# Patient Record
Sex: Female | Born: 1995 | Race: White | Hispanic: No | Smoking: Never smoker
Health system: Southern US, Community
[De-identification: ages and names within clinical notes are randomized; demographics above are authoritative.]

## PROBLEM LIST (undated history)

## (undated) DIAGNOSIS — F32A Depression, unspecified: Secondary | ICD-10-CM

## (undated) DIAGNOSIS — F419 Anxiety disorder, unspecified: Secondary | ICD-10-CM

## (undated) DIAGNOSIS — G43909 Migraine, unspecified, not intractable, without status migrainosus: Secondary | ICD-10-CM

## (undated) HISTORY — DX: Depression, unspecified: F32.A

## (undated) HISTORY — DX: Anxiety disorder, unspecified: F41.9

---
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
## 2017-11-30 ENCOUNTER — Other Ambulatory Visit: Payer: Self-pay

## 2017-11-30 ENCOUNTER — Encounter: Payer: Self-pay | Admitting: *Deleted

## 2017-11-30 ENCOUNTER — Emergency Department
Admission: EM | Admit: 2017-11-30 | Discharge: 2017-11-30 | Disposition: A | Discharge status: Home / Self Care | Payer: Worker's Compensation | Attending: Emergency Medicine | Admitting: Emergency Medicine

## 2017-11-30 DIAGNOSIS — Y929 Unspecified place or not applicable: Secondary | ICD-10-CM | POA: Diagnosis not present

## 2017-11-30 DIAGNOSIS — S39012A Strain of muscle, fascia and tendon of lower back, initial encounter: Secondary | ICD-10-CM | POA: Insufficient documentation

## 2017-11-30 DIAGNOSIS — Z91010 Allergy to peanuts: Secondary | ICD-10-CM | POA: Insufficient documentation

## 2017-11-30 DIAGNOSIS — Z79899 Other long term (current) drug therapy: Secondary | ICD-10-CM | POA: Diagnosis not present

## 2017-11-30 DIAGNOSIS — S3992XA Unspecified injury of lower back, initial encounter: Secondary | ICD-10-CM | POA: Diagnosis present

## 2017-11-30 DIAGNOSIS — Y999 Unspecified external cause status: Secondary | ICD-10-CM | POA: Insufficient documentation

## 2017-11-30 DIAGNOSIS — Y939 Activity, unspecified: Secondary | ICD-10-CM | POA: Insufficient documentation

## 2017-11-30 HISTORY — DX: Migraine, unspecified, not intractable, without status migrainosus: G43.909

## 2017-11-30 MED ORDER — IBUPROFEN 800 MG PO TABS: 800.0000 mg | ORAL_TABLET | Freq: Three times a day (TID) | ORAL | 0 refills | Status: DC | PRN

## 2017-11-30 MED ORDER — IBUPROFEN 800 MG PO TABS
800.0000 mg | ORAL_TABLET | Freq: Once | ORAL | Status: AC
  Administered 2017-11-30: 800 mg via ORAL
  Filled 2017-11-30: qty 1

## 2017-11-30 MED ORDER — CYCLOBENZAPRINE HCL 5 MG PO TABS: 5.0000 mg | ORAL_TABLET | Freq: Three times a day (TID) | ORAL | 0 refills | Status: DC | PRN
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
## 2017-11-30 NOTE — Discharge Instructions (Signed)
Please alternate 800 mg of ibuprofen every 8 hours with Tylenol 1000 mg every 8 hours as needed for pain.  You may use Flexeril as needed for any muscle tightness of the lower back.  Apply ice 20 minutes every hour to the lower back and after 2-3 days apply heat.  Follow-up with orthopedics in 1 week for recheck.  Return to the ER for any headaches, vision changes, nausea, vomiting, worsening symptoms or urgent changes in your health.

## 2017-11-30 NOTE — ED Notes (Addendum)
Pt states she wants to file worker's comp. Per [employer] employer profile, no testing required. Spoke to Office managermanager Shantae at [employer] on [address] in [place] who verified no testing is needed.

## 2017-11-30 NOTE — ED Notes (Signed)
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
## 2018-04-07 ENCOUNTER — Ambulatory Visit (INDEPENDENT_AMBULATORY_CARE_PROVIDER_SITE_OTHER): Payer: 59 | Admitting: Maternal Newborn

## 2018-04-07 ENCOUNTER — Encounter: Payer: Self-pay | Admitting: Maternal Newborn

## 2018-04-07 VITALS — BP 104/80 | HR 72 | Ht 71.0 in | Wt 135.0 lb

## 2018-04-07 DIAGNOSIS — N898 Other specified noninflammatory disorders of vagina: Secondary | ICD-10-CM

## 2018-04-07 DIAGNOSIS — Z3009 Encounter for other general counseling and advice on contraception: Secondary | ICD-10-CM | POA: Diagnosis not present

## 2018-04-07 DIAGNOSIS — G43009 Migraine without aura, not intractable, without status migrainosus: Secondary | ICD-10-CM

## 2018-04-07 DIAGNOSIS — N926 Irregular menstruation, unspecified: Secondary | ICD-10-CM | POA: Diagnosis not present

## 2018-04-07 DIAGNOSIS — Z3202 Encounter for pregnancy test, result negative: Secondary | ICD-10-CM

## 2018-04-07 DIAGNOSIS — B373 Candidiasis of vulva and vagina: Secondary | ICD-10-CM | POA: Diagnosis not present

## 2018-04-07 DIAGNOSIS — N912 Amenorrhea, unspecified: Secondary | ICD-10-CM | POA: Diagnosis not present

## 2018-04-07 DIAGNOSIS — B3731 Acute candidiasis of vulva and vagina: Secondary | ICD-10-CM

## 2018-04-07 LAB — POCT URINE PREGNANCY: Preg Test, Ur: NEGATIVE

## 2018-04-07 MED ORDER — FLUCONAZOLE 150 MG PO TABS: 150.0000 mg | ORAL_TABLET | Freq: Once | ORAL | 0 refills | Status: AC

## 2018-04-07 NOTE — Progress Notes (Signed)
Obstetrics & Gynecology Office Visit   Chief Complaint:  Chief Complaint  Patient presents with  . Gynecologic Exam    ?yeast inf - burning inside, itchy; wants bc as well    History of Present Illness: Lorraine Pena presents today with intermittent vaginal itching, vaginal and vulvar irritation which she describes as "burning", and thick yellowish discharge. She reports that these symptoms began around 2.5 months ago around the time that she had sexual intercourse for the first time. She tried Monistat OTC yeast treatment and felt that her symptoms briefly improved, but then returned and have been worsening over the past two weeks.  She also reports an irregular cycle that began after intercourse, and she has had no period for the past two months.  Intercourse was very painful for her. She does not think that she could be pregnant because she was using a condom and she stopped "in the middle" of intercourse because of the pain.  She wants to discuss birth control today as well.  Review of Systems: She reports migraine headaches without aura. Otherwise, review of systems negative unless noted in HPI.  Past Medical History:  Past Medical History:  Diagnosis Date  . Migraine     Past Surgical History:  No past surgical history on file.  Gynecologic History: Patient's last menstrual period was 02/09/2018 (approximate).  Obstetric History: G0P0000  Family History:  Family History  Problem Relation Age of Onset  . Thyroid disease Sister   . Breast cancer Other     Social History:  Social History   Socioeconomic History  . Marital status: Single    Spouse name: Not on file  . Number of children: 0  . Years of education: 6813  . Highest education level: Not on file  Occupational History  . Occupation: Consulting civil engineerstudent    Comment: [employer]  . Occupation: Physicist, medicalretail sales    Comment: Primary school [employer]  Social Needs  . Financial resource strain: Not on file  . Food insecurity:   Worry: Not on file    Inability: Not on file  . Transportation needs:    Medical: Not on file    Non-medical: Not on file  Tobacco Use  . Smoking status: Never Smoker  . Smokeless tobacco: Never Used  Substance and Sexual Activity  . Alcohol use: No    Frequency: Never  . Drug use: No  . Sexual activity: Yes    Birth control/protection: Condom  Lifestyle  . Physical activity:    Days per week: Not on file    Minutes per session: Not on file  . Stress: Not on file  Relationships  . Social connections:    Talks on phone: Not on file    Gets together: Not on file    Attends religious service: Not on file    Active member of club or organization: Not on file    Attends meetings of clubs or organizations: Not on file    Relationship status: Not on file  . Intimate partner violence:    Fear of current or ex partner: Not on file    Emotionally abused: Not on file    Physically abused: Not on file    Forced sexual activity: Not on file  Other Topics Concern  . Not on file  Social History Narrative  . Not on file    Allergies:  Allergies  Allergen Reactions  . Peanut-Containing Drug Products Itching    Medications: Prior to Admission medications  Medication Sig Start Date End Date Taking? Authorizing Provider  cyclobenzaprine (FLEXERIL) 5 MG tablet Take 1-2 tablets (5-10 mg total) by mouth 3 (three) times daily as needed for muscle spasms. Patient not taking: Reported on 04/07/2018 11/30/17   Evon Slack, PA-C  ibuprofen (ADVIL,MOTRIN) 800 MG tablet Take 1 tablet (800 mg total) by mouth every 8 (eight) hours as needed. Patient not taking: Reported on 04/07/2018 11/30/17   Evon Slack, PA-C    Physical Exam Vitals:  Vitals:   04/07/18 1333  BP: 104/80  Pulse: 72   Patient's last menstrual period was 02/09/2018 (approximate).  General: NAD Pulmonary: No increased work of breathing Genitourinary:  External: External female genitalia appear irritated.     Normal urethral meatus, normal Bartholin's and Skene's  glands.    Vagina: Vaginal mucosa appears irritated, small amount  of thick yellow-white discharge present, no evidence of  prolapse.    Cervix: Deferred  Uterus: Deferred  Adnexa: Deferred  Rectal: Deferred  Lymphatic: no evidence of inguinal lymphadenopathy Neurologic: Grossly intact Psychiatric: mood appropriate, affect full  Wet prep: PH: <4.0 Clue Cells: Negative Fungal elements: Positive Trichomonas: Negative  Assessment: 22 y.o. G0P0000 with yeast infection, discussion about contraception.  Plan: Problem List Items Addressed This Visit    None    Visit Diagnoses    Amenorrhea    -  Primary   Relevant Orders   POCT urine pregnancy (Completed)   Vaginal irritation       Relevant Orders   Wet prep, genital   NuSwab Vaginitis Plus (VG+)   Vaginal discharge       Relevant Orders   Wet prep, genital   NuSwab Vaginitis Plus (VG+)   Encounter for counseling regarding contraception       Vulvovaginal candidiasis       Relevant Medications   fluconazole (DIFLUCAN) 150 MG tablet     1) Patient's cycle has recently become irregular. Pregnancy test negative today.  2) NuSwab sent to rule out co-infection, treated for yeast today.  3) She would like to initiate a more reliable form of contraception. Reviewed birth control options available including over the counter/barrier methods; hormonal contraceptive medication including pill, patch, ring, injection, contraceptive implant; and hormonal IUDs. Risks and benefits reviewed.  Questions were answered.  Written information was given to patient to review.   She is currently considering initiation of OCPs. She does have a history of migraines without aura. She will schedule an appointment for an annual exam.  Marcelyn Bruins, CNM 04/07/2018  2:15 PM

## 2018-04-09 LAB — NUSWAB VAGINITIS PLUS (VG+)
CANDIDA ALBICANS, NAA: NEGATIVE
CANDIDA GLABRATA, NAA: NEGATIVE
Chlamydia trachomatis, NAA: NEGATIVE
NEISSERIA GONORRHOEAE, NAA: NEGATIVE
Trich vag by NAA: NEGATIVE

## 2018-04-28 ENCOUNTER — Ambulatory Visit (INDEPENDENT_AMBULATORY_CARE_PROVIDER_SITE_OTHER): Payer: 59 | Admitting: Maternal Newborn

## 2018-04-28 ENCOUNTER — Encounter: Payer: Self-pay | Admitting: Maternal Newborn

## 2018-04-28 VITALS — BP 100/60 | Ht 71.0 in | Wt 134.0 lb

## 2018-04-28 DIAGNOSIS — Z30011 Encounter for initial prescription of contraceptive pills: Secondary | ICD-10-CM

## 2018-04-28 DIAGNOSIS — R51 Headache: Secondary | ICD-10-CM | POA: Diagnosis not present

## 2018-04-28 DIAGNOSIS — Z01411 Encounter for gynecological examination (general) (routine) with abnormal findings: Secondary | ICD-10-CM | POA: Diagnosis not present

## 2018-04-28 DIAGNOSIS — Z01419 Encounter for gynecological examination (general) (routine) without abnormal findings: Secondary | ICD-10-CM

## 2018-04-28 DIAGNOSIS — N926 Irregular menstruation, unspecified: Secondary | ICD-10-CM | POA: Diagnosis not present

## 2018-04-28 MED ORDER — LO LOESTRIN FE 1 MG-10 MCG / 10 MCG PO TABS: 1.0000 | ORAL_TABLET | Freq: Every day | ORAL | 3 refills | Status: DC

## 2018-04-28 NOTE — Progress Notes (Signed)
Gynecology Annual Exam  PCP: Pa, [facility]  Chief Complaint:  Chief Complaint  Patient presents with  . Gynecologic Exam    History of Present Illness: Patient is a 22 y.o. G0P0000 presenting for an annual exam. The patient has no complaints today.   LMP: Patient's last menstrual period was 01/19/2018. Average Interval: irregular Duration of flow: a few days Heavy Menses: no Clots: no Intermenstrual Bleeding: no Postcoital Bleeding: not applicable Dysmenorrhea: no  The patient is not currently sexually active. She currently uses none for contraception. The patient does not perform self breast exams.  There is no notable family history of breast or ovarian cancer in her family.  The patient wears seatbelts: yes.  The patient has regular exercise: yes.    The patient denies current symptoms of depression.    Review of Systems  Constitutional: Negative.   HENT: Negative.   Eyes: Negative.   Respiratory: Negative for cough, shortness of breath and wheezing.   Cardiovascular: Negative for chest pain and palpitations.  Gastrointestinal: Negative for constipation, diarrhea, heartburn and nausea.  Genitourinary: Negative.   Musculoskeletal: Negative.   Skin: Negative.   Neurological: Positive for headaches.       Migraine without aura  Endo/Heme/Allergies: Negative.   Psychiatric/Behavioral: Negative for memory loss. The patient is not nervous/anxious.   All other systems reviewed and are negative.   Past Medical History:  Past Medical History:  Diagnosis Date  . Migraine     Past Surgical History:  History reviewed. No pertinent surgical history.  Gynecologic History:  Patient's last menstrual period was 01/19/2018. Contraception: none Last Pap: N/A, under 21  Obstetric History: G0P0000  Family History:  Family History  Problem Relation Age of Onset  . Thyroid disease Sister   . Breast cancer Other     Social History:  Social History    Socioeconomic History  . Marital status: Single    Spouse name: Not on file  . Number of children: 0  . Years of education: 6713  . Highest education level: Not on file  Occupational History  . Occupation: Consulting civil engineerstudent    Comment: [employer]  . Occupation: Physicist, medicalretail sales    Comment: Primary school [employer]  Social Needs  . Financial resource strain: Not on file  . Food insecurity:    Worry: Not on file    Inability: Not on file  . Transportation needs:    Medical: Not on file    Non-medical: Not on file  Tobacco Use  . Smoking status: Never Smoker  . Smokeless tobacco: Never Used  Substance and Sexual Activity  . Alcohol use: No    Frequency: Never  . Drug use: No  . Sexual activity: Yes    Birth control/protection: Condom  Lifestyle  . Physical activity:    Days per week: Not on file    Minutes per session: Not on file  . Stress: Not on file  Relationships  . Social connections:    Talks on phone: Not on file    Gets together: Not on file    Attends religious service: Not on file    Active member of club or organization: Not on file    Attends meetings of clubs or organizations: Not on file    Relationship status: Not on file  . Intimate partner violence:    Fear of current or ex partner: Not on file    Emotionally abused: Not on file    Physically abused: Not  on file    Forced sexual activity: Not on file  Other Topics Concern  . Not on file  Social History Narrative  . Not on file    Allergies:  Allergies  Allergen Reactions  . Peanut-Containing Drug Products Itching    Medications: Prior to Admission medications   Medication Sig Start Date End Date Taking? Authorizing Provider  cyclobenzaprine (FLEXERIL) 5 MG tablet Take 1-2 tablets (5-10 mg total) by mouth 3 (three) times daily as needed for muscle spasms. Patient not taking: Reported on 04/07/2018 11/30/17   Evon Slack, PA-C  ibuprofen (ADVIL,MOTRIN) 800 MG tablet Take 1 tablet (800 mg total) by mouth  every 8 (eight) hours as needed. Patient not taking: Reported on 04/07/2018 11/30/17   Ronnette Juniper    Physical Exam Vitals: Blood pressure 100/60, height 5\' 11"  (1.803 m), weight 134 lb (60.8 kg), last menstrual period 01/19/2018.  General: NAD HEENT: normocephalic, anicteric Thyroid: no enlargement, no palpable nodules Pulmonary: No increased work of breathing, CTAB Cardiovascular: RRR, no murmurs, rubs, or gallops Breast: Breasts symmetrical, no tenderness, no palpable nodules or masses, no skin or nipple retraction present, no nipple discharge.  No axillary or supraclavicular lymphadenopathy. Abdomen: soft, non-tender, non-distended.  Umbilicus without lesions.  No hepatomegaly, splenomegaly or masses palpable. No evidence of hernia  Genitourinary:  External: Normal external female genitalia.  Normal  urethral meatus, normal Bartholin's and Skene's glands.    Vagina: Normal vaginal mucosa, no evidence of prolapse.    Cervix: unable to examine, could not tolerate speculum  exam  Uterus: Non-enlarged, mobile, normal contour.  No CMT  Adnexa: ovaries non-enlarged, no adnexal masses  Rectal: deferred  Lymphatic: no evidence of inguinal lymphadenopathy Extremities: no edema, erythema, or tenderness Neurologic: Grossly intact Psychiatric: mood appropriate, affect full  Assessment: 22 y.o. G0P0000 routine annual exam  Plan: Problem List Items Addressed This Visit    None    Visit Diagnoses    Women's annual routine gynecological examination    -  Primary   Oral contraception initial prescription       Relevant Medications   LO LOESTRIN FE 1 MG-10 MCG / 10 MCG tablet      1) STI screening was declined.   2) Contraception - She desires to begin oral contraceptive pills. Discussed side effects and necessity of taking every day to ensure effectiveness. Will start on Lo LoEstrin; sample pack given.  3) Follow up 1 year for routine annual exam.  Marcelyn Bruins,  CNM 04/28/2018

## 2018-04-29 ENCOUNTER — Encounter: Payer: Self-pay | Admitting: Maternal Newborn

## 2018-07-03 ENCOUNTER — Encounter: Payer: Self-pay | Admitting: Physician Assistant

## 2018-07-03 ENCOUNTER — Other Ambulatory Visit: Payer: Self-pay

## 2018-07-03 ENCOUNTER — Emergency Department
Admission: EM | Admit: 2018-07-03 | Discharge: 2018-07-03 | Disposition: A | Discharge status: Home / Self Care | Payer: 59 | Attending: Emergency Medicine | Admitting: Emergency Medicine

## 2018-07-03 DIAGNOSIS — L03115 Cellulitis of right lower limb: Secondary | ICD-10-CM

## 2018-07-03 DIAGNOSIS — Z79899 Other long term (current) drug therapy: Secondary | ICD-10-CM | POA: Diagnosis not present

## 2018-07-03 DIAGNOSIS — R2241 Localized swelling, mass and lump, right lower limb: Secondary | ICD-10-CM | POA: Diagnosis present

## 2018-07-03 MED ORDER — SULFAMETHOXAZOLE-TRIMETHOPRIM 800-160 MG PO TABS: 1.0000 | ORAL_TABLET | Freq: Two times a day (BID) | ORAL | 0 refills | Status: DC

## 2018-07-03 MED ORDER — SULFAMETHOXAZOLE-TRIMETHOPRIM 800-160 MG PO TABS
1.0000 | ORAL_TABLET | Freq: Once | ORAL | Status: AC
  Administered 2018-07-03: 1 via ORAL
  Filled 2018-07-03: qty 1

## 2018-07-03 NOTE — ED Triage Notes (Signed)
Pt arrives to ed via POV. Pt ambulatory to room, states she noticed bite on Monday, pt states it started hurting yesterday. Tender with palpation. 9cm area of redness around the site. Bright red center 2.5cm in diameter. Pt stated started hurts when ambulating.

## 2018-07-03 NOTE — Discharge Instructions (Signed)
Keep the area clean, dry, and covered. Take the antibiotic as directed. See your provider for ongoing management, or return as needed.

## 2018-07-03 NOTE — ED Provider Notes (Signed)
[facility] Emergency Department Provider Note ____________________________________________  Time seen: 1200  I have reviewed the triage vital signs and the nursing notes.  HISTORY  Chief Complaint  Insect Bite  HPI Lorraine Pena is a 22 y.o. female who presents to the ED accompanied by her mother, for evaluation of a probable insect bite.  Patient describes an area to the right lateral thigh has been increasing in size and tenderness.  She describes area that is red, firm, and warm to touch.  She describes noting what she thought was a mosquito bite to the leg initially.  She admits to likely shaving over the area before it began to increase in size.  She presents now with a central punctum and an area measuring about 2.57 m in diameter.  There is an additional area of erythema measuring approximately 9 cm.  Patient denies any interim fevers, chills, or sweats.  Past Medical History:  Diagnosis Date  . Migraine     There are no active problems to display for this patient.   History reviewed. No pertinent surgical history.  Prior to Admission medications   Medication Sig Start Date End Date Taking? Authorizing Provider  cyclobenzaprine (FLEXERIL) 5 MG tablet Take 1-2 tablets (5-10 mg total) by mouth 3 (three) times daily as needed for muscle spasms. Patient not taking: Reported on 04/07/2018 11/30/17   Evon SlackGaines, Thomas C, PA-C  ibuprofen (ADVIL,MOTRIN) 800 MG tablet Take 1 tablet (800 mg total) by mouth every 8 (eight) hours as needed. Patient not taking: Reported on 04/07/2018 11/30/17   Amador CunasGaines, Thomas C, PA-C  LO LOESTRIN FE 1 MG-10 MCG / 10 MCG tablet Take 1 tablet by mouth daily. 04/28/18 07/21/18  Oswaldo ConroySchmid, Jacelyn Y, CNM  sulfamethoxazole-trimethoprim (BACTRIM DS,SEPTRA DS) 800-160 MG tablet Take 1 tablet by mouth 2 (two) times daily. 07/03/18   Reginia Battie, Charlesetta IvoryJenise V Bacon, PA-C    Allergies Peanut-containing drug products  Family History  Problem Relation Age  of Onset  . Thyroid disease Sister   . Breast cancer Other     Social History Social History   Tobacco Use  . Smoking status: Never Smoker  . Smokeless tobacco: Never Used  Substance Use Topics  . Alcohol use: No    Frequency: Never  . Drug use: No    Review of Systems  Constitutional: Negative for fever. Eyes: Negative for visual changes. ENT: Negative for sore throat. Cardiovascular: Negative for chest pain. Respiratory: Negative for shortness of breath. Gastrointestinal: Negative for abdominal pain, vomiting and diarrhea. Musculoskeletal: Negative for back pain. Skin: Negative for rash. Right thigh insect bite as above Neurological: Negative for headaches, focal weakness or numbness. ____________________________________________  PHYSICAL EXAM:  VITAL SIGNS: ED Triage Vitals  Enc Vitals Group     BP 07/03/18 1209 115/74     Pulse Rate 07/03/18 1209 77     Resp --      Temp 07/03/18 1209 98.5 F (36.9 C)     Temp Source 07/03/18 1209 Oral     SpO2 07/03/18 1209 97 %     Weight 07/03/18 1202 130 lb (59 kg)     Height 07/03/18 1202 5\' 11"  (1.803 m)     Head Circumference --      Peak Flow --      Pain Score 07/03/18 1202 5     Pain Loc --      Pain Edu? --      Excl. in GC? --     Constitutional:  Alert and oriented. Well appearing and in no distress. Head: Normocephalic and atraumatic. Eyes: Conjunctivae are normal. Normal extraocular movements Cardiovascular: Normal rate, regular rhythm. Normal distal pulses. Respiratory: Normal respiratory effort. No wheezes/rales/rhonchi. Musculoskeletal: Nontender with normal range of motion in all extremities.  Neurologic:  Normal gait without ataxia. Normal speech and language. No gross focal neurologic deficits are appreciated. Skin:  Skin is warm, dry and intact. No rash noted.  Patient with a area to the lateral right thigh measuring 9 cm in diameter of erythema and induration around a 2.5 cm central lesion.  There  is a central punctum consistent with a probable insect bite versus a resolved pustule.  Area is warm to touch and tender to palpation.  There is no fluctuance, pointing, or central abscess appreciated. ____________________________________________  PROCEDURES  Procedures Bactrim DS 1 PO ____________________________________________  INITIAL IMPRESSION / ASSESSMENT AND PLAN / ED COURSE  Patient with ED evaluation of an insect bite to the lateral right thigh.  Patient presents with a local cellulitis.  She will be treated empirically with Bactrim for possible MRSA infection.  She will take the antibiotic as prescribed, and monitor for improvement of her symptoms.  The area around the wound has been marked at the perimeter to confirm improvement.  Patient keep the area clean, dry, and covered. ____________________________________________  FINAL CLINICAL IMPRESSION(S) / ED DIAGNOSES  Final diagnoses:  Cellulitis of right lower extremity      Karmen Stabs, Charlesetta Ivory, PA-C 07/03/18 1351    Jeanmarie Plant, MD 07/03/18 [phone]

## 2018-07-06 ENCOUNTER — Encounter: Payer: Self-pay | Admitting: Emergency Medicine

## 2018-07-06 ENCOUNTER — Other Ambulatory Visit: Payer: Self-pay

## 2018-07-06 ENCOUNTER — Emergency Department
Admission: EM | Admit: 2018-07-06 | Discharge: 2018-07-06 | Disposition: A | Discharge status: Home / Self Care | Payer: 59 | Attending: Emergency Medicine | Admitting: Emergency Medicine

## 2018-07-06 DIAGNOSIS — Z5189 Encounter for other specified aftercare: Secondary | ICD-10-CM | POA: Insufficient documentation

## 2018-07-06 DIAGNOSIS — S70361D Insect bite (nonvenomous), right thigh, subsequent encounter: Secondary | ICD-10-CM | POA: Diagnosis not present

## 2018-07-06 DIAGNOSIS — Z79899 Other long term (current) drug therapy: Secondary | ICD-10-CM | POA: Diagnosis not present

## 2018-07-06 DIAGNOSIS — L02415 Cutaneous abscess of right lower limb: Secondary | ICD-10-CM | POA: Insufficient documentation

## 2018-07-06 DIAGNOSIS — F419 Anxiety disorder, unspecified: Secondary | ICD-10-CM | POA: Insufficient documentation

## 2018-07-06 DIAGNOSIS — W57XXXD Bitten or stung by nonvenomous insect and other nonvenomous arthropods, subsequent encounter: Secondary | ICD-10-CM | POA: Insufficient documentation

## 2018-07-06 DIAGNOSIS — Z91010 Allergy to peanuts: Secondary | ICD-10-CM | POA: Insufficient documentation

## 2018-07-06 MED ORDER — LIDOCAINE HCL (PF) 1 % IJ SOLN
INTRAMUSCULAR | Status: AC
  Filled 2018-07-06: qty 5

## 2018-07-06 MED ORDER — TRAMADOL HCL 50 MG PO TABS
50.0000 mg | ORAL_TABLET | Freq: Once | ORAL | Status: AC
  Administered 2018-07-06: 50 mg via ORAL
  Filled 2018-07-06: qty 1

## 2018-07-06 MED ORDER — TRAMADOL HCL 50 MG PO TABS: 50.0000 mg | ORAL_TABLET | Freq: Two times a day (BID) | ORAL | 0 refills | Status: DC | PRN

## 2018-07-06 NOTE — ED Provider Notes (Signed)
[facility] Emergency Department Provider Note   ____________________________________________   First MD Initiated Contact with Patient 07/06/18 0915     (approximate)  I have reviewed the triage vital signs and the nursing notes.   HISTORY  Chief Complaint Wound Check    HPI Lorraine Pena is a 22 y.o. female patient here for follow-up for insect bite to the right thigh 3 days ago.  Patient was seen at this facility and placed on antibiotics and advised to return back if no improvement.  Patient states the lesion has not spread past the marked area but has increased in size.  Patient denies drainage.  Patient denies fever associated with this complaint.  Patient rates the pain as a 3/10.  Patient described the pain is "dull ache".   Past Medical History:  Diagnosis Date  . Migraine     There are no active problems to display for this patient.   History reviewed. No pertinent surgical history.  Prior to Admission medications   Medication Sig Start Date End Date Taking? Authorizing Provider  cyclobenzaprine (FLEXERIL) 5 MG tablet Take 1-2 tablets (5-10 mg total) by mouth 3 (three) times daily as needed for muscle spasms. Patient not taking: Reported on 04/07/2018 11/30/17   Evon SlackGaines, Thomas C, PA-C  ibuprofen (ADVIL,MOTRIN) 800 MG tablet Take 1 tablet (800 mg total) by mouth every 8 (eight) hours as needed. Patient not taking: Reported on 04/07/2018 11/30/17   Amador CunasGaines, Thomas C, PA-C  LO LOESTRIN FE 1 MG-10 MCG / 10 MCG tablet Take 1 tablet by mouth daily. 04/28/18 07/21/18  Oswaldo ConroySchmid, Jacelyn Y, CNM  sulfamethoxazole-trimethoprim (BACTRIM DS,SEPTRA DS) 800-160 MG tablet Take 1 tablet by mouth 2 (two) times daily. 07/03/18   Menshew, Charlesetta IvoryJenise V Bacon, PA-C  traMADol (ULTRAM) 50 MG tablet Take 1 tablet (50 mg total) by mouth every 12 (twelve) hours as needed. 07/06/18   Joni ReiningSmith, Luciel Brickman K, PA-C    Allergies Peanut-containing drug products  Family History  Problem  Relation Age of Onset  . Thyroid disease Sister   . Breast cancer Other     Social History Social History   Tobacco Use  . Smoking status: Never Smoker  . Smokeless tobacco: Never Used  Substance Use Topics  . Alcohol use: No    Frequency: Never  . Drug use: No    Review of Systems Constitutional: No fever/chills Eyes: No visual changes. ENT: No sore throat. Cardiovascular: Denies chest pain. Respiratory: Denies shortness of breath. Gastrointestinal: No abdominal pain.  No nausea, no vomiting.  No diarrhea.  No constipation. Genitourinary: Negative for dysuria. Musculoskeletal: Negative for back pain. Skin: Negative for rash.  Insect bite right side Neurological: Negative for headaches, focal weakness or numbness. Allergic/Immunilogical: Peanuts  ____________________________________________   PHYSICAL EXAM:  VITAL SIGNS: ED Triage Vitals  Enc Vitals Group     BP 07/06/18 0900 126/86     Pulse Rate 07/06/18 0900 90     Resp 07/06/18 0900 18     Temp 07/06/18 0900 98.1 F (36.7 C)     Temp Source 07/06/18 0900 Oral     SpO2 07/06/18 0900 98 %     Weight 07/06/18 0903 130 lb (59 kg)     Height 07/06/18 0903 5\' 11"  (1.803 m)     Head Circumference --      Peak Flow --      Pain Score 07/06/18 0903 3     Pain Loc --  Pain Edu? --      Excl. in GC? --    Constitutional: Alert and oriented. Well appearing and in no acute distress.  Anxious Cardiovascular: Normal rate, regular rhythm. Grossly normal heart sounds.  Good peripheral circulation. Respiratory: Normal respiratory effort.  No retractions. Lungs CTAB. Skin:  Skin is warm, dry and intact. No rash noted.  Nodule lesion on erythematous base right lateral thigh. Psychiatric: Mood and affect are normal. Speech and behavior are normal.  ____________________________________________   LABS (all labs ordered are listed, but only abnormal results are displayed)  Labs Reviewed - No data to  display ____________________________________________  EKG   ____________________________________________  RADIOLOGY  ED MD interpretation:    Official radiology report(s): No results found.  ____________________________________________   PROCEDURES  Procedure(s) performed: None  .Marland KitchenIncision and Drainage Date/Time: 07/06/2018 9:30 AM Performed by: Joni Reining, PA-C Authorized by: Joni Reining, PA-C   Consent:    Consent obtained:  Verbal   Consent given by:  Patient   Risks discussed:  Bleeding, incomplete drainage, pain and infection Location:    Type:  Abscess   Location:  Lower extremity   Lower extremity location:  Leg   Leg location:  R upper leg Pre-procedure details:    Skin preparation:  Betadine Anesthesia (see MAR for exact dosages):    Anesthesia method:  Local infiltration   Local anesthetic:  Lidocaine 1% w/o epi Procedure type:    Complexity:  Simple Procedure details:    Incision types:  Single straight   Incision depth:  Dermal   Scalpel blade:  11   Wound management:  Probed and deloculated   Drainage amount:  Moderate   Wound treatment:  Drain placed   Packing materials:  1/4 in iodoform gauze Post-procedure details:    Patient tolerance of procedure:  Tolerated well, no immediate complications    Critical Care performed: No  ____________________________________________   INITIAL IMPRESSION / ASSESSMENT AND PLAN / ED COURSE  As part of my medical decision making, I reviewed the following data within the electronic MEDICAL RECORD NUMBER    Abscess right leg secondary to insect bite which required incision and drainage today.  Patient given discharge care instruction.  Patient advised to continue antibiotics.  Patient advised to return back in 2 days for wound recheck.      ____________________________________________   FINAL CLINICAL IMPRESSION(S) / ED DIAGNOSES  Final diagnoses:  Wound check, abscess     ED Discharge  Orders         Ordered    traMADol (ULTRAM) 50 MG tablet  Every 12 hours PRN     07/06/18 0944           Note:  This document was prepared using Dragon voice recognition software and may include unintentional dictation errors.    Joni Reining, PA-C 07/06/18 1610    Jene Every, MD 07/06/18 1201

## 2018-07-06 NOTE — ED Triage Notes (Signed)
States was seen Friday for possible for sider bite R thigh. States swelling has gone down. Here for wound check.

## 2018-07-06 NOTE — ED Notes (Signed)
See triage note  Was seen on friday for insect bite to right leg  Area is red and swollen  Per family the redness has improved but center of area is more swollen and tender

## 2018-07-06 NOTE — Discharge Instructions (Signed)
Follow discharge care instruction continue previous medication.

## 2018-07-08 ENCOUNTER — Encounter: Payer: Self-pay | Admitting: Emergency Medicine

## 2018-07-08 ENCOUNTER — Emergency Department
Admission: EM | Admit: 2018-07-08 | Discharge: 2018-07-08 | Disposition: A | Discharge status: Home / Self Care | Payer: 59 | Attending: Emergency Medicine | Admitting: Emergency Medicine

## 2018-07-08 ENCOUNTER — Other Ambulatory Visit: Payer: Self-pay

## 2018-07-08 DIAGNOSIS — Z79899 Other long term (current) drug therapy: Secondary | ICD-10-CM | POA: Insufficient documentation

## 2018-07-08 DIAGNOSIS — Z09 Encounter for follow-up examination after completed treatment for conditions other than malignant neoplasm: Secondary | ICD-10-CM

## 2018-07-08 DIAGNOSIS — Z91010 Allergy to peanuts: Secondary | ICD-10-CM | POA: Diagnosis not present

## 2018-07-08 DIAGNOSIS — S81801D Unspecified open wound, right lower leg, subsequent encounter: Secondary | ICD-10-CM | POA: Diagnosis not present

## 2018-07-08 DIAGNOSIS — X58XXXA Exposure to other specified factors, initial encounter: Secondary | ICD-10-CM | POA: Insufficient documentation

## 2018-07-08 NOTE — ED Triage Notes (Signed)
Pt states had abscess lanced on 9/16, returns today for wound recheck, packing in place with small amounts of bloody drainage noted to wound, pt states redness improving as well.

## 2018-07-08 NOTE — ED Notes (Signed)
NAD noted at time of D/C. Pt denies questions or concerns. Pt ambulatory to the lobby at this time.  

## 2018-07-08 NOTE — ED Provider Notes (Signed)
[facility] Emergency Department Provider Note   ____________________________________________   First MD Initiated Contact with Patient 07/08/18 1011     (approximate)  I have reviewed the triage vital signs and the nursing notes.   HISTORY  Chief Complaint Wound Check    HPI Lorraine Pena is a 22 y.o. female patient presents for wound check secondary to incision and drainage of abscess to the right lateral leg 2 days ago.  Patient was no complaints at this time.   Past Medical History:  Diagnosis Date  . Migraine     There are no active problems to display for this patient.   History reviewed. No pertinent surgical history.  Prior to Admission medications   Medication Sig Start Date End Date Taking? Authorizing Provider  cyclobenzaprine (FLEXERIL) 5 MG tablet Take 1-2 tablets (5-10 mg total) by mouth 3 (three) times daily as needed for muscle spasms. Patient not taking: Reported on 04/07/2018 11/30/17   Evon Slack, PA-C  ibuprofen (ADVIL,MOTRIN) 800 MG tablet Take 1 tablet (800 mg total) by mouth every 8 (eight) hours as needed. Patient not taking: Reported on 04/07/2018 11/30/17   Amador Cunas C, PA-C  LO LOESTRIN FE 1 MG-10 MCG / 10 MCG tablet Take 1 tablet by mouth daily. 04/28/18 07/21/18  Oswaldo Conroy, CNM  sulfamethoxazole-trimethoprim (BACTRIM DS,SEPTRA DS) 800-160 MG tablet Take 1 tablet by mouth 2 (two) times daily. 07/03/18   Menshew, Charlesetta Ivory, PA-C  traMADol (ULTRAM) 50 MG tablet Take 1 tablet (50 mg total) by mouth every 12 (twelve) hours as needed. 07/06/18   Joni Reining, PA-C    Allergies Peanut-containing drug products  Family History  Problem Relation Age of Onset  . Thyroid disease Sister   . Breast cancer Other     Social History Social History   Tobacco Use  . Smoking status: Never Smoker  . Smokeless tobacco: Never Used  Substance Use Topics  . Alcohol use: No    Frequency: Never  . Drug  use: No    Review of Systems  Constitutional: No fever/chills Eyes: No visual changes. ENT: No sore throat. Cardiovascular: Denies chest pain. Respiratory: Denies shortness of breath. Gastrointestinal: No abdominal pain.  No nausea, no vomiting.  No diarrhea.  No constipation. Genitourinary: Negative for dysuria. Musculoskeletal: Negative for back pain. Skin: Healing abscess right lower leg.  Neurological: Negative for headaches, focal weakness or numbness. Allergic/Immunilogical: Peanuts ____________________________________________   PHYSICAL EXAM:  VITAL SIGNS: ED Triage Vitals  Enc Vitals Group     BP 07/08/18 0954 (!) 122/92     Pulse Rate 07/08/18 0954 69     Resp 07/08/18 0954 18     Temp 07/08/18 0954 98.3 F (36.8 C)     Temp Source 07/08/18 0954 Oral     SpO2 07/08/18 0954 98 %     Weight 07/08/18 1004 130 lb 1.1 oz (59 kg)     Height 07/08/18 1004 5\' 11"  (1.803 m)     Head Circumference --      Peak Flow --      Pain Score 07/08/18 1004 2     Pain Loc --      Pain Edu? --      Excl. in GC? --    Constitutional: Alert and oriented. Well appearing and in no acute distress. Cardiovascular: Normal rate, regular rhythm. Grossly normal heart sounds.  Good peripheral circulation. Respiratory: Normal respiratory effort.  No retractions. Lungs CTAB. Skin:  Incision site right lateral leg secondary to incision and drainage.  Psychiatric: Mood and affect are normal. Speech and behavior are normal.  ____________________________________________   LABS (all labs ordered are listed, but only abnormal results are displayed)  Labs Reviewed - No data to display ____________________________________________  EKG   ____________________________________________  RADIOLOGY  ED MD interpretation:    Official radiology report(s): No results found.  ____________________________________________   PROCEDURES  Procedure(s) performed: None  Procedures  Critical Care  performed: No  ____________________________________________   INITIAL IMPRESSION / ASSESSMENT AND PLAN / ED COURSE  As part of my medical decision making, I reviewed the following data within the electronic MEDICAL RECORD NUMBER    Healing abscess right leg status post I&D.  Packing material was removed with wound irrigated with clear return.  Patient wound was clean and bandaged and patient given discharge care instructions.  Advised follow-up department if condition worsens.      ____________________________________________   FINAL CLINICAL IMPRESSION(S) / ED DIAGNOSES  Final diagnoses:  Encounter for recheck of abscess following incision and drainage     ED Discharge Orders    None       Note:  This document was prepared using Dragon voice recognition software and may include unintentional dictation errors.    Joni ReiningSmith, Allee Busk K, PA-C 07/08/18 1042    Emily FilbertWilliams, Jonathan E, MD 07/08/18 [phone]

## 2018-07-08 NOTE — ED Notes (Signed)
See this RN's triage.

## 2018-07-30 ENCOUNTER — Encounter: Payer: Self-pay | Admitting: Obstetrics and Gynecology

## 2018-07-30 ENCOUNTER — Ambulatory Visit (INDEPENDENT_AMBULATORY_CARE_PROVIDER_SITE_OTHER): Payer: 59 | Admitting: Obstetrics and Gynecology

## 2018-07-30 VITALS — BP 102/58 | HR 72 | Ht 71.0 in | Wt 134.0 lb

## 2018-07-30 DIAGNOSIS — N3001 Acute cystitis with hematuria: Secondary | ICD-10-CM

## 2018-07-30 DIAGNOSIS — Z113 Encounter for screening for infections with a predominantly sexual mode of transmission: Secondary | ICD-10-CM

## 2018-07-30 DIAGNOSIS — R300 Dysuria: Secondary | ICD-10-CM

## 2018-07-30 DIAGNOSIS — R102 Pelvic and perineal pain: Secondary | ICD-10-CM | POA: Diagnosis not present

## 2018-07-30 DIAGNOSIS — N760 Acute vaginitis: Secondary | ICD-10-CM

## 2018-07-30 LAB — POCT URINALYSIS DIPSTICK
Bilirubin, UA: NEGATIVE
Glucose, UA: NEGATIVE
KETONES UA: NEGATIVE
Nitrite, UA: NEGATIVE
PH UA: 6.5 (ref 5.0–8.0)
PROTEIN UA: POSITIVE — AB
SPEC GRAV UA: 1.02 (ref 1.010–1.025)
UROBILINOGEN UA: 0.2 U/dL

## 2018-07-30 MED ORDER — NITROFURANTOIN MONOHYD MACRO 100 MG PO CAPS: 100.0000 mg | ORAL_CAPSULE | Freq: Two times a day (BID) | ORAL | 0 refills | Status: DC

## 2018-07-30 MED ORDER — FLUCONAZOLE 150 MG PO TABS: 150.0000 mg | ORAL_TABLET | ORAL | 0 refills | Status: AC

## 2018-07-30 NOTE — Progress Notes (Signed)
   Patient ID: Lorraine Pena, female   DOB: [birth year], 22 y.o.   MRN: [id number]  Reason for Consult: Vaginitis   Referred by Pa, [facility]*  Subjective:     HPI:  Lorraine Pena is a 22 y.o. female - she reports severe cramping 2 days after period stopped, caused nausea, vaginal discharge that is yellow.    Past Medical History:  Diagnosis Date  . Migraine    Family History  Problem Relation Age of Onset  . Thyroid disease Sister   . Breast cancer Other    History reviewed. No pertinent surgical history.  Short Social History:  Social History   Tobacco Use  . Smoking status: Never Smoker  . Smokeless tobacco: Never Used  Substance Use Topics  . Alcohol use: No    Frequency: Never    Allergies  Allergen Reactions  . Peanut-Containing Drug Products Itching    Current Outpatient Medications  Medication Sig Dispense Refill  . cyclobenzaprine (FLEXERIL) 5 MG tablet Take 1-2 tablets (5-10 mg total) by mouth 3 (three) times daily as needed for muscle spasms. (Patient not taking: Reported on 04/07/2018) 20 tablet 0  . fluconazole (DIFLUCAN) 150 MG tablet Take 1 tablet (150 mg total) by mouth every 3 (three) days for 2 doses. 2 tablet 0  . ibuprofen (ADVIL,MOTRIN) 800 MG tablet Take 1 tablet (800 mg total) by mouth every 8 (eight) hours as needed. (Patient not taking: Reported on 04/07/2018) 30 tablet 0  . LO LOESTRIN FE 1 MG-10 MCG / 10 MCG tablet Take 1 tablet by mouth daily. 84 tablet 3  . sulfamethoxazole-trimethoprim (BACTRIM DS,SEPTRA DS) 800-160 MG tablet Take 1 tablet by mouth 2 (two) times daily. (Patient not taking: Reported on 07/30/2018) 20 tablet 0  . traMADol (ULTRAM) 50 MG tablet Take 1 tablet (50 mg total) by mouth every 12 (twelve) hours as needed. (Patient not taking: Reported on 07/30/2018) 12 tablet 0   No current facility-administered medications for this visit.     REVIEW OF SYSTEMS      Objective:  Objective   Vitals:    07/30/18 1049  BP: (!) 102/58  Pulse: 72  Weight: 134 lb (60.8 kg)  Height: 5\' 11"  (1.803 m)   Body mass index is 18.69 kg/m.  Physical Exam  Da     Assessment/Plan:     21 yo  Yeast infection- diflucan prescription sent UTI- will treat with antibiotics NUswab VG plus for confirmation STD screening Discussed birth control options.     Adelene Idler MD [facility], [facility] 07/30/18 11:51 AM

## 2018-07-31 LAB — RPR: RPR: NONREACTIVE

## 2018-07-31 LAB — HEPATITIS PANEL, ACUTE
HEP A IGM: NEGATIVE
HEP B C IGM: NEGATIVE
HEP B S AG: NEGATIVE

## 2018-07-31 LAB — HIV ANTIBODY (ROUTINE TESTING W REFLEX): HIV Screen 4th Generation wRfx: NONREACTIVE

## 2018-08-01 LAB — URINE CULTURE

## 2018-08-02 LAB — NUSWAB VAGINITIS PLUS (VG+)
CANDIDA ALBICANS, NAA: POSITIVE — AB
CANDIDA GLABRATA, NAA: NEGATIVE
Chlamydia trachomatis, NAA: NEGATIVE
Neisseria gonorrhoeae, NAA: NEGATIVE
TRICH VAG BY NAA: NEGATIVE

## 2018-08-04 NOTE — Progress Notes (Signed)
Discussed on the phone with patient. She is feeling better on medication for the UTI and yeast infection

## 2018-09-01 ENCOUNTER — Ambulatory Visit: Payer: 59 | Admitting: Obstetrics and Gynecology

## 2018-09-02 ENCOUNTER — Ambulatory Visit (INDEPENDENT_AMBULATORY_CARE_PROVIDER_SITE_OTHER): Payer: 59 | Admitting: Obstetrics and Gynecology

## 2018-09-02 ENCOUNTER — Other Ambulatory Visit (HOSPITAL_COMMUNITY)
Admission: RE | Admit: 2018-09-02 | Discharge: 2018-09-02 | Disposition: A | Discharge status: Home / Self Care | Payer: 59 | Source: Ambulatory Visit | Attending: Obstetrics and Gynecology | Admitting: Obstetrics and Gynecology

## 2018-09-02 ENCOUNTER — Encounter: Payer: Self-pay | Admitting: Obstetrics and Gynecology

## 2018-09-02 VITALS — BP 98/66 | HR 57 | Ht 71.0 in | Wt 132.0 lb

## 2018-09-02 DIAGNOSIS — Z113 Encounter for screening for infections with a predominantly sexual mode of transmission: Secondary | ICD-10-CM | POA: Diagnosis not present

## 2018-09-02 DIAGNOSIS — Z202 Contact with and (suspected) exposure to infections with a predominantly sexual mode of transmission: Secondary | ICD-10-CM | POA: Insufficient documentation

## 2018-09-02 DIAGNOSIS — N3001 Acute cystitis with hematuria: Secondary | ICD-10-CM | POA: Diagnosis not present

## 2018-09-02 LAB — POCT URINALYSIS DIPSTICK
Bilirubin, UA: NEGATIVE
GLUCOSE UA: NEGATIVE
Ketones, UA: NEGATIVE
Nitrite, UA: NEGATIVE
PH UA: 5 (ref 5.0–8.0)
PROTEIN UA: NEGATIVE
Spec Grav, UA: 1.02 (ref 1.010–1.025)

## 2018-09-02 MED ORDER — NITROFURANTOIN MONOHYD MACRO 100 MG PO CAPS: 100.0000 mg | ORAL_CAPSULE | Freq: Two times a day (BID) | ORAL | 0 refills | Status: AC

## 2018-09-02 NOTE — Progress Notes (Signed)
Pa, [employer] Complaint  Patient presents with  . STI testing    pt thinks she has UTI, burns when urinates, frequency, no blood or odor and would like to get tested for all since she found out her bf cheated on her    HPI:      [name] is a 22 y.o. G0P0000 who LMP was Patient's last menstrual period was 08/17/2018 (approximate)., presents today for STD testing. Found out previous partner was unfaithful 2 days ago. She doesn't use condoms due to latex allergy. Had neg STD testing, including HIV, RPR, Hep panel, 07/30/18 and neg nuswab 04/07/18. No vag sx.   Also has UTI sx of dysuria, frequency since yesterday. No LBP, belly pain, fevers. No vag bleeding. Treated for UTI 9/19 with Bactrim. Had pos C&S.  Past Medical History:  Diagnosis Date  . Migraine     History reviewed. No pertinent surgical history.  Family History  Problem Relation Age of Onset  . Thyroid disease Sister   . Breast cancer Other     Social History   Socioeconomic History  . Marital status: Single    Spouse name: Not on file  . Number of children: 0  . Years of education: 29  . Highest education level: Not on file  Occupational History  . Occupation: Consulting civil engineer    Comment: [employer]  . Occupation: Physicist, 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[employer]  . Financial resource strain: Not on file  . Food insecurity:    Worry: Not on file    Inability: Not on file  . Transportation needs:    Medical: Not on file    Non-medical: Not on file  Tobacco Use  . Smoking status: Never Smoker  . Smokeless tobacco: Never Used  Substance and Sexual Activity  . Alcohol use: No    Frequency: Never  . Drug use: No  . Sexual activity: Yes    Birth control/protection: Pill  Lifestyle  . Physical activity:    Days per week: Not on file    Minutes per session: Not on file  . Stress: Not on file  Relationships  . Social connections:    Talks on phone: Not on file      Gets together: Not on file    Attends religious service: Not on file    Active member of club or organization: Not on file    Attends meetings of clubs or organizations: Not on file    Relationship status: Not on file  . Intimate partner violence:    Fear of current or ex partner: Not on file    Emotionally abused: Not on file    Physically abused: Not on file    Forced sexual activity: Not on file  Other Topics Concern  . Not on file  Social History Narrative  . Not on file    Outpatient Medications Prior to Visit  Medication Sig Dispense Refill  . ibuprofen (ADVIL,MOTRIN) 800 MG tablet Take 1 tablet (800 mg total) by mouth every 8 (eight) hours as needed. 30 tablet 0  . LO LOESTRIN FE 1 MG-10 MCG / 10 MCG tablet Take 1 tablet by mouth daily. 84 tablet 3  . cyclobenzaprine (FLEXERIL) 5 MG tablet Take 1-2 tablets (5-10 mg total) by mouth 3 (three) times daily as needed for muscle spasms. (Patient not taking: Reported on 04/07/2018) 20 tablet 0  . sulfamethoxazole-trimethoprim (BACTRIM DS,SEPTRA DS)  800-160 MG tablet Take 1 tablet by mouth 2 (two) times daily. (Patient not taking: Reported on 07/30/2018) 20 tablet 0  . traMADol (ULTRAM) 50 MG tablet Take 1 tablet (50 mg total) by mouth every 12 (twelve) hours as needed. (Patient not taking: Reported on 07/30/2018) 12 tablet 0   No facility-administered medications prior to visit.       ROS:  Review of Systems  Constitutional: Negative for fatigue, fever and unexpected weight change.  Respiratory: Negative for cough, shortness of breath and wheezing.   Cardiovascular: Negative for chest pain, palpitations and leg swelling.  Gastrointestinal: Negative for blood in stool, constipation, diarrhea, nausea and vomiting.  Endocrine: Negative for cold intolerance, heat intolerance and polyuria.  Genitourinary: Positive for dysuria and frequency. Negative for dyspareunia, flank pain, genital sores, hematuria, menstrual problem, pelvic  pain, urgency, vaginal bleeding, vaginal discharge and vaginal pain.  Musculoskeletal: Negative for back pain, joint swelling and myalgias.  Skin: Negative for rash.  Neurological: Negative for dizziness, syncope, light-headedness, numbness and headaches.  Hematological: Negative for adenopathy.  Psychiatric/Behavioral: Negative for agitation, confusion, sleep disturbance and suicidal ideas. The patient is not nervous/anxious.    BREAST: No symptoms   OBJECTIVE:   Vitals:  BP 98/66   Pulse (!) 57   Ht 5\' 11"  (1.803 m)   Wt 132 lb (59.9 kg)   LMP 08/17/2018 (Approximate)   BMI 18.41 kg/m   Physical Exam  Constitutional: She is oriented to person, place, and time. Vital signs are normal. She appears well-developed.  Pulmonary/Chest: Effort normal.  Genitourinary: Vagina normal and uterus normal. There is no rash, tenderness or lesion on the right labia. There is no rash, tenderness or lesion on the left labia. Uterus is not enlarged and not tender. Cervix exhibits no motion tenderness. Right adnexum displays no mass and no tenderness. Left adnexum displays no mass and no tenderness. No erythema or tenderness in the vagina. No vaginal discharge found.  Musculoskeletal: Normal range of motion.  Neurological: She is alert and oriented to person, place, and time.  Psychiatric: She has a normal mood and affect. Her behavior is normal. Thought content normal.  Vitals reviewed.   Results: Results for orders placed or performed in visit on 09/02/18 (from the past 24 hour(s))  POCT Urinalysis Dipstick     Status: Abnormal   Collection Time: 09/02/18  9:04 AM  Result Value Ref Range   Color, UA amber    Clarity, UA clear    Glucose, UA Negative Negative   Bilirubin, UA neg    Ketones, UA neg    Spec Grav, UA 1.020 1.010 - 1.025   Blood, UA small    pH, UA 5.0 5.0 - 8.0   Protein, UA Negative Negative   Urobilinogen, UA     Nitrite, UA neg    Leukocytes, UA Trace (A) Negative    Appearance     Odor       Assessment/Plan: Acute cystitis with hematuria - Pos sx/UA. Rx macrobid. Check C&S. F/u prn - Plan: POCT Urinalysis Dipstick, Urine Culture, nitrofurantoin, macrocrystal-monohydrate, (MACROBID) 100 MG capsule  Screening for STD (sexually transmitted disease) - STD testing. Will call with results. Suggested HIV testing in 3 months since neg 10/19. - Plan: Cervicovaginal ancillary only, RPR, Hepatitis C antibody  Possible exposure to STD - Plan: Cervicovaginal ancillary only    Meds ordered this encounter  Medications  . nitrofurantoin, macrocrystal-monohydrate, (MACROBID) 100 MG capsule    Sig: Take 1 capsule (100 mg  total) by mouth 2 (two) times daily for 5 days.    Dispense:  10 capsule    Refill:  0    Order Specific Question:   Supervising Provider    Answer:   Nadara Mustard [[id number]]      Return if symptoms worsen or fail to improve.  Digby Groeneveld B. Muad [place]oga, PA-C 09/02/2018 9:06 AM

## 2018-09-02 NOTE — Patient Instructions (Signed)
I value your feedback and entrusting us with your care. If you get a  patient survey, I would appreciate you taking the time to let us know about your experience today. Thank you! 

## 2018-09-03 LAB — RPR: RPR Ser Ql: NONREACTIVE

## 2018-09-03 LAB — CERVICOVAGINAL ANCILLARY ONLY
Chlamydia: NEGATIVE
NEISSERIA GONORRHEA: NEGATIVE
Trichomonas: NEGATIVE

## 2018-09-03 LAB — HEPATITIS C ANTIBODY

## 2018-09-03 NOTE — Progress Notes (Signed)
Pt aware.

## 2018-09-03 NOTE — Progress Notes (Signed)
Pls let pt know blood work testing neg. Still waiting on gon/chlam. Thx

## 2018-09-04 LAB — URINE CULTURE

## 2018-09-04 NOTE — Progress Notes (Signed)
Pls let pt know all STD testing and C&S neg. Can stop abx. F/u prn.

## 2018-09-04 NOTE — Progress Notes (Signed)
Called pt, no answer, LVMTRC. 

## 2018-10-11 ENCOUNTER — Other Ambulatory Visit: Payer: Self-pay

## 2018-10-11 ENCOUNTER — Encounter: Payer: Self-pay | Admitting: Emergency Medicine

## 2018-10-11 ENCOUNTER — Emergency Department
Admission: EM | Admit: 2018-10-11 | Discharge: 2018-10-11 | Disposition: A | Discharge status: Home / Self Care | Payer: 59 | Attending: Emergency Medicine | Admitting: Emergency Medicine

## 2018-10-11 DIAGNOSIS — J101 Influenza due to other identified influenza virus with other respiratory manifestations: Secondary | ICD-10-CM | POA: Insufficient documentation

## 2018-10-11 DIAGNOSIS — J029 Acute pharyngitis, unspecified: Secondary | ICD-10-CM | POA: Diagnosis present

## 2018-10-11 LAB — GROUP A STREP BY PCR: Group A Strep by PCR: NOT DETECTED

## 2018-10-11 LAB — INFLUENZA PANEL BY PCR (TYPE A & B)
INFLAPCR: NEGATIVE
Influenza B By PCR: POSITIVE — AB

## 2018-10-11 MED ORDER — BENZONATATE 200 MG PO CAPS: 200.0000 mg | ORAL_CAPSULE | Freq: Three times a day (TID) | ORAL | 0 refills | Status: DC | PRN

## 2018-10-11 NOTE — ED Notes (Signed)
See triage note States she had sore throat on Friday  But yesterday she developed subjective fever  Chills and body aches   Afebrile on arrival but states she has no energy

## 2018-10-11 NOTE — Discharge Instructions (Addendum)
Follow-up with your regular doctor or the acute care if not better in 5 to 7 days.  Return emergency department worsening.  Tylenol and ibuprofen for fever as needed.  Cough medication as prescribed.  Drink plenty of fluids.

## 2018-10-11 NOTE — ED Triage Notes (Signed)
Sore throat x 2 days. Generalized body aches.

## 2018-10-11 NOTE — ED Provider Notes (Signed)
[facility] Emergency Department Provider Note  ____________________________________________   First MD Initiated Contact with Patient 10/11/18 1400     (approximate)  I have reviewed the triage vital signs and the nursing notes.   HISTORY  Chief Complaint Sore Throat    HPI Lorraine Pena is a 22 y.o. female presents emergency department complaint of sore throat and fever for 3 days.  She has had chills and body aches with a dry cough.  She has no energy.  She states that fever started yesterday.  She states multiple people at work have flu.  Denies any vomiting or diarrhea.    Past Medical History:  Diagnosis Date  . Migraine     There are no active problems to display for this patient.   History reviewed. No pertinent surgical history.  Prior to Admission medications   Medication Sig Start Date End Date Taking? Authorizing Provider  benzonatate (TESSALON) 200 MG capsule Take 1 capsule (200 mg total) by mouth 3 (three) times daily as needed for cough. 10/11/18   , Roselyn BeringSusan W, PA-C  ibuprofen (ADVIL,MOTRIN) 800 MG tablet Take 1 tablet (800 mg total) by mouth every 8 (eight) hours as needed. 11/30/17   Amador CunasGaines, Thomas C, PA-C  LO LOESTRIN FE 1 MG-10 MCG / 10 MCG tablet Take 1 tablet by mouth daily. 04/28/18 07/21/18  Oswaldo ConroySchmid, Jacelyn Y, CNM    Allergies Peanut-containing drug products  Family History  Problem Relation Age of Onset  . Thyroid disease Sister   . Breast cancer Other     Social History Social History   Tobacco Use  . Smoking status: Never Smoker  . Smokeless tobacco: Never Used  Substance Use Topics  . Alcohol use: No    Frequency: Never  . Drug use: No    Review of Systems  Constitutional: Positive fever/chills Eyes: No visual changes. ENT: Positive sore throat. Respiratory: Positive cough Genitourinary: Negative for dysuria. Musculoskeletal: Negative for back pain. Skin: Negative for  rash.    ____________________________________________   PHYSICAL EXAM:  VITAL SIGNS: ED Triage Vitals  Enc Vitals Group     BP 10/11/18 1335 111/89     Pulse Rate 10/11/18 1335 94     Resp 10/11/18 1335 12     Temp 10/11/18 1335 99.6 F (37.6 C)     Temp Source 10/11/18 1335 Oral     SpO2 10/11/18 1335 99 %     Weight 10/11/18 1339 135 lb (61.2 kg)     Height 10/11/18 1339 5\' 11"  (1.803 m)     Head Circumference --      Peak Flow --      Pain Score 10/11/18 1339 4     Pain Loc --      Pain Edu? --      Excl. in GC? --     Constitutional: Alert and oriented. Well appearing and in no acute distress. Eyes: Conjunctivae are normal.  Head: Atraumatic. Nose: No congestion/rhinnorhea. Mouth/Throat: Mucous membranes are moist.  Throat is mildly red Neck:  supple no lymphadenopathy noted Cardiovascular: Normal rate, regular rhythm. Heart sounds are normal Respiratory: Normal respiratory effort.  No retractions, lungs c t a  Abd: soft nontender bs normal all 4 quad GU: deferred Musculoskeletal: FROM all extremities, warm and well perfused Neurologic:  Normal speech and language.  Skin:  Skin is warm, dry and intact. No rash noted. Psychiatric: Mood and affect are normal. Speech and behavior are normal.  ____________________________________________  LABS (all labs ordered are listed, but only abnormal results are displayed)  Labs Reviewed  INFLUENZA PANEL BY PCR (TYPE A & B) - Abnormal; Notable for the following components:      Result Value   Influenza B By PCR POSITIVE (*)    All other components within normal limits  GROUP A STREP BY PCR   ____________________________________________   ____________________________________________  RADIOLOGY    ____________________________________________   PROCEDURES  Procedure(s) performed: No  Procedures    ____________________________________________   INITIAL IMPRESSION / ASSESSMENT AND PLAN / ED  COURSE  Pertinent labs & imaging results that were available during my care of the patient were reviewed by me and considered in my medical decision making (see chart for details).   Patient is 22 year old female presents emergency department flulike symptoms.  Physical exam shows a nontoxic patient.  Throat is mildly red.  Lungs clear to all station.  Minor exam is unremarkable  Strep test is negative, influenza test positive for B  Explained findings to the patient.  She does not want Tamiflu she is started it might make her worse.  And because is very expensive.  She is to drink plenty of fluids, take Tylenol and ibuprofen for fever as needed.  Remain out of work until she has been fever free for 24 hours.  She states she understands and will comply.  She was instructed to return if worsening.  She was discharged in stable condition.     As part of my medical decision making, I reviewed the following data within the electronic MEDICAL RECORD NUMBER Nursing notes reviewed and incorporated, Labs reviewed flu swab positive for B, strep negative, Old chart reviewed, Notes from prior ED visits and [place] Controlled Substance Database  ____________________________________________   FINAL CLINICAL IMPRESSION(S) / ED DIAGNOSES  Final diagnoses:  Influenza B      NEW MEDICATIONS STARTED DURING THIS VISIT:  Discharge Medication List as of 10/11/2018  3:14 PM    START taking these medications   Details  benzonatate (TESSALON) 200 MG capsule Take 1 capsule (200 mg total) by mouth 3 (three) times daily as needed for cough., Starting Sun 10/11/2018, Normal         Note:  This document was prepared using Dragon voice recognition software and may include unintentional dictation errors.    Faythe GheeFisher,  W, PA-C 10/11/18 1651    Schaevitz, Myra Rudeavid Matthew, MD 10/12/18 [id number]

## 2018-12-20 HISTORY — PX: ANKLE SURGERY: SHX546

## 2019-05-05 ENCOUNTER — Other Ambulatory Visit (HOSPITAL_COMMUNITY)
Admission: RE | Admit: 2019-05-05 | Discharge: 2019-05-05 | Disposition: A | Discharge status: Home / Self Care | Payer: 59 | Source: Ambulatory Visit | Attending: Maternal Newborn | Admitting: Maternal Newborn

## 2019-05-05 ENCOUNTER — Other Ambulatory Visit: Payer: Self-pay

## 2019-05-05 ENCOUNTER — Ambulatory Visit (INDEPENDENT_AMBULATORY_CARE_PROVIDER_SITE_OTHER): Payer: 59 | Admitting: Maternal Newborn

## 2019-05-05 ENCOUNTER — Encounter: Payer: Self-pay | Admitting: Maternal Newborn

## 2019-05-05 VITALS — BP 120/80 | Ht 71.0 in | Wt 138.0 lb

## 2019-05-05 DIAGNOSIS — Z01419 Encounter for gynecological examination (general) (routine) without abnormal findings: Secondary | ICD-10-CM

## 2019-05-05 DIAGNOSIS — Z113 Encounter for screening for infections with a predominantly sexual mode of transmission: Secondary | ICD-10-CM

## 2019-05-05 DIAGNOSIS — F419 Anxiety disorder, unspecified: Secondary | ICD-10-CM

## 2019-05-05 DIAGNOSIS — Z3009 Encounter for other general counseling and advice on contraception: Secondary | ICD-10-CM

## 2019-05-05 DIAGNOSIS — F329 Major depressive disorder, single episode, unspecified: Secondary | ICD-10-CM

## 2019-05-05 DIAGNOSIS — Z124 Encounter for screening for malignant neoplasm of cervix: Secondary | ICD-10-CM | POA: Diagnosis present

## 2019-05-05 DIAGNOSIS — Z30016 Encounter for initial prescription of transdermal patch hormonal contraceptive device: Secondary | ICD-10-CM

## 2019-05-05 MED ORDER — XULANE 150-35 MCG/24HR TD PTWK: 1.0000 | MEDICATED_PATCH | TRANSDERMAL | 12 refills | Status: DC

## 2019-05-05 NOTE — Progress Notes (Signed)
Gynecology Annual Exam  PCP: Pa, [facility]  Chief Complaint:  Chief Complaint  Patient presents with  . Gynecologic Exam  . Contraception    History of Present Illness: Patient is a 23 y.o. G0P0000 presenting for an annual exam.   LMP: Patient's last menstrual period was 04/12/2019. Average Interval: monthly Duration of flow: several days Heavy Menses: no Clots: no Intermenstrual Bleeding: no Postcoital Bleeding: no Dysmenorrhea: no  The patient is sexually active. She was using OCP (estrogen/progesterone) for contraception. She does not have dyspareunia.  The patient does not perform self breast exams.  There is no notable family history of breast or ovarian cancer in her family.  The patient wears seatbelts: yes.  The patient has regular exercise: yes.    The patient has current symptoms of depression and anxiety. She is concerned that she may have undiagnosed bipolar disorder. She describes sudden mood swings. She has been having similar symptoms for years (since around age 37). She also has suicidal ideations sometimes, without a plan to harm herself. She listens to music to cope with these thoughts. She desires a referral to psychiatry.    Review of Systems  Constitutional: Negative.   HENT: Positive for congestion.   Eyes: Negative.   Respiratory: Negative for cough, shortness of breath and wheezing.   Cardiovascular: Negative for chest pain and palpitations.  Gastrointestinal: Negative.  Negative for abdominal pain and nausea.  Genitourinary: Negative.   Musculoskeletal: Negative.   Skin: Negative.   Neurological: Negative.   Endo/Heme/Allergies: Positive for environmental allergies.  Psychiatric/Behavioral: Positive for depression. The patient is nervous/anxious.     Past Medical History:  Past Medical History:  Diagnosis Date  . Migraine     Past Surgical History:  History reviewed. No pertinent surgical history.  Gynecologic History:   Patient's last menstrual period was 04/12/2019. Contraception: OCP Last Pap: Has not had a Pap in the past  Obstetric History: G0P0000  Family History:  Family History  Problem Relation Age of Onset  . Thyroid disease Sister   . Breast cancer Other     Social History:  Social History   Socioeconomic History  . Marital status: Single    Spouse name: Not on file  . Number of children: 0  . Years of education: 80  . Highest education level: Not on file  Occupational History  . Occupation: Ship broker    Comment: [employer]  . Occupation: Administrator, arts    Comment: Publishing copy at [employer]  . Financial resource strain: Not on file  . Food insecurity    Worry: Not on file    Inability: Not on file  . Transportation needs    Medical: Not on file    Non-medical: Not on file  Tobacco Use  . Smoking status: Never Smoker  . Smokeless tobacco: Never Used  Substance and Sexual Activity  . Alcohol use: No    Frequency: Never  . Drug use: No  . Sexual activity: Yes  Lifestyle  . Physical activity    Days per week: Not on file    Minutes per session: Not on file  . Stress: Not on file  Relationships  . Social Herbalist on phone: Not on file    Gets together: Not on file    Attends religious service: Not on file    Active member of club or organization: Not on file    Attends meetings of clubs or organizations: Not  on file    Relationship status: Not on file  . Intimate partner violence    Fear of current or ex partner: Not on file    Emotionally abused: Not on file    Physically abused: Not on file    Forced sexual activity: Not on file  Other Topics Concern  . Not on file  Social History Narrative  . Not on file    Allergies:  Allergies  Allergen Reactions  . Peanut-Containing Drug Products Itching    Medications: Prior to Admission medications   Medication Sig Start Date End Date Taking? Authorizing Provider  ibuprofen (ADVIL,MOTRIN) 800  MG tablet Take 1 tablet (800 mg total) by mouth every 8 (eight) hours as needed. 11/30/17  Yes Evon SlackGaines, Thomas C, PA-C  benzonatate (TESSALON) 200 MG capsule Take 1 capsule (200 mg total) by mouth 3 (three) times daily as needed for cough. Patient not taking: Reported on 05/05/2019 10/11/18   Faythe GheeFisher, Susan W, PA-C  LO LOESTRIN FE 1 MG-10 MCG / 10 MCG tablet Take 1 tablet by mouth daily. 04/28/18 07/21/18  Oswaldo ConroySchmid,  Y, CNM    Physical Exam Vitals: Blood pressure 120/80, height 5\' 11"  (1.803 m), weight 138 lb (62.6 kg), last menstrual period 04/12/2019.  General: NAD HEENT: normocephalic, anicteric Thyroid: no enlargement, no palpable nodules Pulmonary: No increased work of breathing, CTAB Cardiovascular: RRR, no murmurs, rubs, or gallops Breast: Breasts symmetrical, no tenderness, no palpable nodules or masses, no skin or nipple retraction present, no nipple discharge.  No axillary or supraclavicular lymphadenopathy. Abdomen: Soft, non-tender, non-distended.  Umbilicus without lesions.  No hepatomegaly, splenomegaly or masses palpable. No evidence of hernia  Genitourinary:  External: Normal external female genitalia.  Normal urethral  meatus, normal Bartholin's and Skene's glands.    Vagina: Normal vaginal mucosa, no evidence of prolapse.    Cervix: Grossly normal in appearance, no bleeding  Uterus: Non-enlarged, mobile, normal contour.  No CMT  Adnexa: ovaries non-enlarged, no adnexal masses  Rectal: deferred  Lymphatic: no evidence of inguinal lymphadenopathy Extremities: no edema, erythema, or tenderness Neurologic: Grossly intact Psychiatric: mood appropriate, affect full  Assessment: 23 y.o. G0P0000 routine annual exam, anxiety and depression, desires contraceptive patch  Plan: Problem List Items Addressed This Visit    None    Visit Diagnoses    Routine screening for STI (sexually transmitted infection)    -  Primary   Relevant Orders   HEP, RPR, HIV Panel (Completed)    HSV(herpes smplx)abs-1+2(IgG+IgM)-bld (Completed)   Cytology - PAP (Completed)   Pap smear for cervical cancer screening       Relevant Orders   Cytology - PAP (Completed)   Encounter for initial prescription of transdermal patch hormonal contraceptive device       Relevant Medications   norelgestromin-ethinyl estradiol Burr Medico(XULANE) 150-35 MCG/24HR transdermal patch   Encounter for counseling regarding contraception       Anxiety and depression       Relevant Orders   Ambulatory referral to Psychiatry      1) STI screening was offered and accepted  2) ASCCP guidelines and rationale discussed.  Patient opts for every 3 year screening interval  3) Contraception - She would like to try the contraceptive patch. Discussed use, method of action. Written brochure given. Sample pack of Xulane given. Rx sent.  4) Referral to psychiatry for symptoms of anxiety and depression, some suicidal ideations. Discussed medication and she would like to have psychiatric provider determine which medicine would be best. She  contracted for safety today and is aware to call us for help as needed. Also gave written list of therapists as backup if referred provider not covered by insurance.  5) Follow up 1 year for an annual exam.  Marcelyn BruinsJacelyn , CNM 05/05/2019

## 2019-05-05 NOTE — Patient Instructions (Signed)
Therapists/Counselors/Psychologists   Karen Canada, LPC  & Michelle Van Horton    [phone]        [address]       [place], [place] [address]        Gary Bailey, CSW [phone] [address] [place], [place] [address]  Julia Tabor, LPC        [phone]        [address]      [place], [place] [address]        [place], [place] [phone] [address] [place], [place] [address]  Joanna Warren, LMFT       [phone]        [address]       [place], [place] [address]        Tina Thompson [phone] [address] [place], [place] [address]  Amanda Miller        [phone]        [address]       [place], [place] [address]        Bart McCormick [phone] [address] [place], [place] [address]  Cristin Saffo, PsyD       [phone]        [address]       [place], [place] [address]        Cheryl Lawson, LPC [phone] [address]  [place], [place] [address]   Courtney Jones        [phone]        [address]      [place], [place] [address]         Laura Ellington [facility] [phone] [email or web address]   Sation Konchella       [phone]        [address]       [place], [place] [address]        Carmen Bork [facility] [phone] [email or web address]   ADD/ADHD Testing:   Peter Lolli, PhD [address] [place], [place] [address] [phone] BCBS   [facility] in [place], [place]   Mary Heiney, MS LPA [address] [place], [place] [address] Phone: [phone] Fax: [phone] UMR , BCBS, Medicaid   Louise Lampron Welker [address] [place], [place] [address] [phone]    [facility] in [place], [place]   ----------------------------------------------------------   Neuropsych Testing Dr Kristine Herfkens [address] [place], [place] P: [phone] F: [phone] [email or web address]    [facility]  Liz Leopold [place] Therapist: [phone]     Eating Disorders   [facility]  [email or web address] [phone]  Heather Kitchen [facility] [phone], ext [phone]   Substance Abuse Treatment   [facility], [place], [place] [facility] [place], [place] [facility] [place], [place], [place], [place] [facility], [place],  [place], [place], [place]   Charity Care Program:  [email or web address] 

## 2019-05-07 LAB — CYTOLOGY - PAP
Chlamydia: NEGATIVE
Diagnosis: NEGATIVE
Neisseria Gonorrhea: NEGATIVE

## 2019-05-07 LAB — HEP, RPR, HIV PANEL
HIV Screen 4th Generation wRfx: NONREACTIVE
Hepatitis B Surface Ag: NEGATIVE
RPR Ser Ql: NONREACTIVE

## 2019-05-07 LAB — HSV(HERPES SMPLX)ABS-I+II(IGG+IGM)-BLD
HSV 1 Glycoprotein G Ab, IgG: <0.91 index (ref 0.00–0.90)
HSV 2 IgG, Type Spec: <0.91 index (ref 0.00–0.90)
HSVI/II Comb IgM: <0.91 Ratio (ref 0.00–0.90)

## 2019-05-10 ENCOUNTER — Encounter: Payer: Self-pay | Admitting: Maternal Newborn

## 2019-10-11 ENCOUNTER — Other Ambulatory Visit: Payer: Self-pay | Admitting: Obstetrics and Gynecology

## 2019-12-13 ENCOUNTER — Other Ambulatory Visit: Payer: Self-pay | Admitting: Obstetrics and Gynecology

## 2019-12-31 ENCOUNTER — Emergency Department (HOSPITAL_COMMUNITY)
Admission: EM | Admit: 2019-12-31 | Discharge: 2020-01-01 | Disposition: A | Discharge status: Home / Self Care | Payer: 59 | Attending: Emergency Medicine | Admitting: Emergency Medicine

## 2019-12-31 ENCOUNTER — Encounter (HOSPITAL_COMMUNITY): Payer: Self-pay | Admitting: Emergency Medicine

## 2019-12-31 ENCOUNTER — Emergency Department (HOSPITAL_COMMUNITY): Payer: 59

## 2019-12-31 ENCOUNTER — Other Ambulatory Visit: Payer: Self-pay

## 2019-12-31 DIAGNOSIS — S99919A Unspecified injury of unspecified ankle, initial encounter: Secondary | ICD-10-CM

## 2019-12-31 DIAGNOSIS — Z91010 Allergy to peanuts: Secondary | ICD-10-CM | POA: Diagnosis not present

## 2019-12-31 DIAGNOSIS — Y9341 Activity, dancing: Secondary | ICD-10-CM | POA: Insufficient documentation

## 2019-12-31 DIAGNOSIS — Y929 Unspecified place or not applicable: Secondary | ICD-10-CM | POA: Diagnosis not present

## 2019-12-31 DIAGNOSIS — S82301A Unspecified fracture of lower end of right tibia, initial encounter for closed fracture: Secondary | ICD-10-CM | POA: Diagnosis not present

## 2019-12-31 DIAGNOSIS — S82831A Other fracture of upper and lower end of right fibula, initial encounter for closed fracture: Secondary | ICD-10-CM | POA: Diagnosis not present

## 2019-12-31 DIAGNOSIS — Y998 Other external cause status: Secondary | ICD-10-CM | POA: Insufficient documentation

## 2019-12-31 DIAGNOSIS — S99912A Unspecified injury of left ankle, initial encounter: Secondary | ICD-10-CM | POA: Diagnosis present

## 2019-12-31 DIAGNOSIS — Z79899 Other long term (current) drug therapy: Secondary | ICD-10-CM | POA: Insufficient documentation

## 2019-12-31 DIAGNOSIS — S9305XA Dislocation of left ankle joint, initial encounter: Secondary | ICD-10-CM | POA: Insufficient documentation

## 2019-12-31 DIAGNOSIS — X509XXA Other and unspecified overexertion or strenuous movements or postures, initial encounter: Secondary | ICD-10-CM | POA: Diagnosis not present

## 2019-12-31 MED ORDER — FENTANYL CITRATE (PF) 100 MCG/2ML IJ SOLN
100.0000 ug | INTRAMUSCULAR | Status: AC | PRN
  Administered 2019-12-31 – 2020-01-01 (×3): 100 ug via INTRAVENOUS
  Filled 2019-12-31 (×3): qty 2

## 2019-12-31 MED ORDER — ONDANSETRON HCL 4 MG/2ML IJ SOLN
4.0000 mg | Freq: Once | INTRAMUSCULAR | Status: AC
  Administered 2019-12-31: 4 mg via INTRAVENOUS
  Filled 2019-12-31: qty 2

## 2019-12-31 MED ORDER — ETOMIDATE 2 MG/ML IV SOLN
0.1500 mg/kg | Freq: Once | INTRAVENOUS | Status: AC
  Administered 2020-01-01: 10.2 mg via INTRAVENOUS
  Filled 2019-12-31: qty 10

## 2019-12-31 NOTE — ED Provider Notes (Signed)
[facility]-EMERGENCY DEPT Provider Note   CSN: [id number] Arrival date & time: 12/31/19  2111     History Chief Complaint  Patient presents with  . Ankle Injury    Lorraine Pena is a 24 y.o. female.  HPI She presents for evaluation of injury to the right ankle.  Occurred while she was dancing, shortly prior to arrival here.  She was transferred by private vehicle.  She drank some orange juice after she injured her ankle.  She ate some fluid about an hour ago.  She denies other injury.  No other recent illnesses.  There are no other known modifying factors.    .   Past Medical History:  Diagnosis Date  . Migraine     There are no problems to display for this patient.   History reviewed. No pertinent surgical history.   OB History    Gravida  0   Para  0   Term  0   Preterm  0   AB  0   Living  0     SAB  0   TAB  0   Ectopic  0   Multiple  0   Live Births  0           Family History  Problem Relation Age of Onset  . Thyroid disease Sister   . Breast cancer Other     Social History   Tobacco Use  . Smoking status: Never Smoker  . Smokeless tobacco: Never Used  Substance Use Topics  . Alcohol use: Yes  . Drug use: No    Home Medications Prior to Admission medications   Medication Sig Start Date End Date Taking? Authorizing Provider  ibuprofen (ADVIL,MOTRIN) 800 MG tablet Take 1 tablet (800 mg total) by mouth every 8 (eight) hours as needed. 11/30/17  Yes Evon Slack, PA-C  norelgestromin-ethinyl estradiol Burr Medico) 150-35 MCG/24HR transdermal patch Place 1 patch onto the skin once a week. 05/05/19  Yes Oswaldo Conroy, CNM  venlafaxine XR (EFFEXOR-XR) 75 MG 24 hr capsule Take 75 mg by mouth every morning. 12/07/19  Yes [provider]  benzonatate (TESSALON) 200 MG capsule Take 1 capsule (200 mg total) by mouth 3 (three) times daily as needed for cough. Patient not taking: Reported on 05/05/2019  10/11/18   Faythe Ghee, PA-C    Allergies    Morphine and related and Peanut-containing drug products  Review of Systems   Review of Systems  All other systems reviewed and are negative.   Physical Exam Updated Vital Signs BP (!) 147/99   Pulse (!) 105   Temp 98.1 F (36.7 C) (Oral)   Resp 20   Ht 5\' 6"  (1.676 m)   Wt 68 kg   LMP  (LMP Unknown)   SpO2 100%   BMI 24.21 kg/m   Physical Exam Vitals and nursing note reviewed.  Constitutional:      General: She is in acute distress (Uncomfortable).     Appearance: She is well-developed. She is not ill-appearing, toxic-appearing or diaphoretic.  HENT:     Head: Normocephalic and atraumatic.     Right Ear: External ear normal.     Left Ear: External ear normal.  Eyes:     Conjunctiva/sclera: Conjunctivae normal.     Pupils: Pupils are equal, round, and reactive to light.  Neck:     Trachea: Phonation normal.  Cardiovascular:     Rate and Rhythm: Tachycardia present.  Pulmonary:  Effort: Pulmonary effort is normal. No respiratory distress.     Breath sounds: No stridor.  Musculoskeletal:     Cervical back: Normal range of motion and neck supple.     Comments: Right ankle deformity, with swelling.  No skin break concerning for compound fracture.  Neurovascular and sensation intact distally in the toes of the right foot.  Skin:    General: Skin is warm and dry.  Neurological:     Mental Status: She is alert and oriented to person, place, and time.     Cranial Nerves: No cranial nerve deficit.     Sensory: No sensory deficit.     Motor: No abnormal muscle tone.     Coordination: Coordination normal.  Psychiatric:        Behavior: Behavior normal.        Thought Content: Thought content normal.        Judgment: Judgment normal.     ED Results / Procedures / Treatments   Labs (all labs ordered are listed, but only abnormal results are displayed) Labs Reviewed - No data to  display  EKG None  Radiology DG Ankle Right Port  Result Date: 12/31/2019 CLINICAL DATA:  Twisting injury with deformity EXAM: PORTABLE RIGHT ANKLE - 2 VIEW COMPARISON:  None. FINDINGS: Acute comminuted fracture involving the distal shaft of the fibula with moderate posterior and lateral angulation of distal fracture fragment. Posterior dislocation of the talus with respect to the distal tibia. Acute comminuted posterior malleolar fracture. IMPRESSION: 1. Acute comminuted and angulated distal fibular fracture 2. Acute comminuted posterior malleolar fracture. There is posterior dislocation of the talus with respect to the distal tibia Electronically Signed   By: Donavan Foil M.D.   On: 12/31/2019 22:12    Procedures .Sedation  Date/Time: 01/01/2020 12:13 AM Performed by: Daleen Bo, MD Authorized by: Daleen Bo, MD   Consent:    Consent obtained:  Verbal and written   Consent given by:  Patient and parent   Alternatives discussed:  Analgesia without sedation Universal protocol:    Immediately prior to procedure a time out was called: yes   Indications:    Procedure performed:  Dislocation reduction   Procedure necessitating sedation performed by:  Different physician Pre-sedation assessment:    Time since last food or drink:  3 hours   ASA classification: class 1 - normal, healthy patient     Mouth opening:  3 or more finger widths   Mallampati score:  II - soft palate, uvula, fauces visible   Pre-sedation assessments completed and reviewed: airway patency, cardiovascular function, hydration status, mental status and pain level     Pre-sedation assessments completed and reviewed: pre-procedure nausea and vomiting status not reviewed     Pre-sedation assessment completed:  01/01/2020 12:01 AM Immediate pre-procedure details:    Reassessment: Patient reassessed immediately prior to procedure     Reviewed: vital signs, relevant labs/tests and NPO status     Verified: bag  valve mask available, emergency equipment available, intubation equipment available, IV patency confirmed and oxygen available   Procedure details (see MAR for exact dosages):    Preoxygenation:  Nasal cannula   Sedation:  Etomidate   Intended level of sedation: deep   Analgesia:  Fentanyl   Intra-procedure monitoring:  Blood pressure monitoring, cardiac monitor, continuous capnometry, continuous pulse oximetry, frequent LOC assessments and frequent vital sign checks   Intra-procedure events: none     Intra-procedure management:  Airway repositioning   Total Provider sedation  time (minutes):  8 Post-procedure details:    Post-sedation assessment completed:  01/01/2020 12:15 AM   Attendance: Constant attendance by certified staff until patient recovered     Recovery: Patient returned to pre-procedure baseline     Post-sedation assessments completed and reviewed: airway patency, cardiovascular function, hydration status, mental status and nausea/vomiting     Post-sedation assessments completed and reviewed: pain level not reviewed     Patient is stable for discharge or admission: no     Patient tolerance:  Tolerated well, no immediate complications   (including critical care time)  Medications Ordered in ED Medications  fentaNYL (SUBLIMAZE) injection 100 mcg (100 mcg Intravenous Given 01/01/20 0000)  ondansetron (ZOFRAN) injection 4 mg (4 mg Intravenous Given 12/31/19 2233)  etomidate (AMIDATE) injection 10.2 mg (10.2 mg Intravenous Given 01/01/20 0000)    ED Course  I have reviewed the triage vital signs and the nursing notes.  Pertinent labs & imaging results that were available during my care of the patient were reviewed by me and considered in my medical decision making (see chart for details).  Clinical Course as of Dec 31 16  Fri Dec 31, 2019  2251 Ankle dislocation with comminuted posterior malleolus fracture, and distal fibula fracture.   [EW]  2306 Case discussed with  orthopedics, Dr. Linna Caprice.  He request EDP reduction of fracture dislocation, and contact him with results following imaging.   [EW]    Clinical Course User Index [EW] Mancel Bale, MD   MDM Rules/Calculators/A&P                       Patient Vitals for the past 24 hrs:  BP Temp Temp src Pulse Resp SpO2 Height Weight  01/01/20 0010 (!) 147/99 -- -- (!) 105 20 100 % -- --  01/01/20 0005 (!) 140/111 -- -- (!) 110 13 99 % -- --  01/01/20 0000 122/83 -- -- 96 18 100 % -- --  12/31/19 2355 127/89 -- -- 94 (!) 22 100 % -- --  12/31/19 2315 (!) 132/95 -- -- 89 17 100 % 5\' 6"  (1.676 m) 68 kg  12/31/19 2300 (!) 141/102 -- -- 95 (!) 24 98 % -- --  12/31/19 2245 (!) 156/93 -- -- 97 16 100 % -- --  12/31/19 2136 (!) 138/93 98.1 F (36.7 C) Oral (!) 114 14 95 % -- --      Medical Decision Making: Isolated injury, right ankle, fracture dislocation.  Orthopedics consulted.  MINNIE LEGROS was evaluated in Emergency Department on 01/01/2020 for the symptoms described in the history of present illness. She was evaluated in the context of the global COVID-19 pandemic, which necessitated consideration that the patient might be at risk for infection with the SARS-CoV-2 virus that causes COVID-19. Institutional protocols and algorithms that pertain to the evaluation of patients at risk for COVID-19 are in a state of rapid change based on information released by regulatory bodies including the CDC and federal and state organizations. These policies and algorithms were followed during the patient's care in the ED.  CRITICAL CARE-no Performed by: 01/03/2020   Nursing Notes Reviewed/ Care Coordinated Applicable Imaging Reviewed Interpretation of Laboratory Data incorporated into ED treatment  See PA note for disposition following reduction and imaging.  Anticipate disposition is discharged to follow-up with orthopedics as an outpatient.   Final Clinical Impression(s) / ED Diagnoses Final  diagnoses:  Ankle injury  Ankle dislocation, left, initial encounter  Closed fracture  of distal end of right tibia, unspecified fracture morphology, initial encounter  Closed fracture of distal end of right fibula, unspecified fracture morphology, initial encounter    Rx / DC Orders ED Discharge Orders    None       Mancel Bale, MD 01/01/20 1032

## 2019-12-31 NOTE — ED Triage Notes (Signed)
Pt reports dancing at the bar and falling. Pt reports she doesn't remember what happened after falling. Pt has consumed multiple alcoholic beverages. Pt now endorses right ankle pain. Deformity noted. Pulses palpable bilaterally.

## 2020-01-01 ENCOUNTER — Emergency Department (HOSPITAL_COMMUNITY): Payer: 59

## 2020-01-01 MED ORDER — HYDROCODONE-ACETAMINOPHEN 5-325 MG PO TABS
2.0000 | ORAL_TABLET | Freq: Once | ORAL | Status: AC
  Administered 2020-01-01: 2 via ORAL
  Filled 2020-01-01: qty 2

## 2020-01-01 MED ORDER — HYDROCODONE-ACETAMINOPHEN 5-325 MG PO TABS: 1.0000 | ORAL_TABLET | Freq: Four times a day (QID) | ORAL | 0 refills | Status: DC | PRN

## 2020-01-01 MED ORDER — METHOCARBAMOL 500 MG PO TABS: 500.0000 mg | ORAL_TABLET | Freq: Two times a day (BID) | ORAL | 0 refills | Status: DC

## 2020-01-01 NOTE — Progress Notes (Signed)
Orthopedic Tech Progress Note Patient Details:  Lorraine Pena 02-18-1996 [id number]  Ortho Devices Type of Ortho Device: Post (short leg) splint, Stirrup splint Ortho Device/Splint Location: rle Ortho Device/Splint Interventions: Ordered, Application, Adjustment   Post Interventions Patient Tolerated: Well Instructions Provided: Care of device, Adjustment of device   Trinna Post 01/01/2020, 12:16 AM

## 2020-01-01 NOTE — Discharge Instructions (Addendum)
1. Medications: robaxin and hydrocodone for pain control, usual home medications 2. Treatment: rest, drink plenty of fluids, elevate, use crutches (no walking), ice 3. Follow Up: Please followup with Dr. Linna Caprice next week; Please return to the ER for numbness, tingling, color change or other concerns

## 2020-01-01 NOTE — ED Provider Notes (Signed)
Reduction of fracture  Date/Time: 01/01/2020 12:14 AM Performed by: Abigail Butts, PA-C Authorized by: Abigail Butts, PA-C  Consent: Verbal consent obtained. Risks and benefits: risks, benefits and alternatives were discussed Consent given by: patient Patient understanding: patient states understanding of the procedure being performed Patient consent: the patient's understanding of the procedure matches consent given Procedure consent: procedure consent matches procedure scheduled Relevant documents: relevant documents present and verified Test results: test results available and properly labeled Site marked: the operative site was marked Imaging studies: imaging studies available Required items: required blood products, implants, devices, and special equipment available Patient identity confirmed: verbally with patient and arm band Time out: Immediately prior to procedure a "time out" was called to verify the correct patient, procedure, equipment, support staff and site/side marked as required. Preparation: Patient was prepped and draped in the usual sterile fashion. Local anesthesia used: no  Anesthesia: Local anesthesia used: no  Sedation: Patient sedated: yes Sedation type: moderate (conscious) sedation Sedatives: etomidate Analgesia: fentanyl Vitals: Vital signs were monitored during sedation.  Patient tolerance: patient tolerated the procedure well with no immediate complications Comments: Reduction of R ankle fracture with capillary refill, sensation and movement of toes intact. Dr. Eulis Foster at bedside for procedure.  Marland KitchenSplint Application  Date/Time: 01/01/2020 12:16 AM Performed by: Abigail Butts, PA-C Authorized by: Daleen Bo, MD   Consent:    Consent obtained:  Verbal   Consent given by:  Patient   Risks discussed:  Discoloration, numbness, pain and swelling   Alternatives discussed:  No treatment Pre-procedure details:    Sensation:   Normal   Skin color:  Flesh Procedure details:    Laterality:  Right   Location:  Ankle   Ankle:  R ankle   Cast type:  Short leg   Splint type:  Ankle stirrup (posterior and stirrup)   Supplies:  Ortho-Glass Post-procedure details:    Pain:  Improved   Sensation:  Normal   Skin color:  Flesh   Patient tolerance of procedure:  Tolerated well, no immediate complications    DG Ankle 2 Views Right  Result Date: 01/01/2020 CLINICAL DATA:  24 year old female status post reduction of the right ankle fracture dislocation. EXAM: RIGHT ANKLE - 2 VIEW COMPARISON:  Earlier radiograph dated 12/31/2019. FINDINGS: Interval reduction in the degree of displacement and angulation of the distal fibular and posterior malleolar fractures and realignment of the ankle mortise. Interval placement of a cast. IMPRESSION: Interval reduction of the displaced and angulated distal tibial and fibular fractures and realignment of the ankle mortise. Electronically Signed   By: Anner Crete M.D.   On: 01/01/2020 00:30   CT Ankle Right Wo Contrast  Result Date: 01/01/2020 CLINICAL DATA:  24 year old female with right ankle fracture. EXAM: CT OF THE RIGHT ANKLE WITHOUT CONTRAST TECHNIQUE: Multidetector CT imaging of the right ankle was performed according to the standard protocol. Multiplanar CT image reconstructions were also generated. COMPARISON:  Earlier radiographs dated 12/31/2019 and 01/01/2020. FINDINGS: Bones/Joint/Cartilage There is a comminuted mildly displaced fracture of the distal fibula with approximately 5 mm posterior displacement of the distal major fracture fragment. There is a mildly displaced fracture of the posterior malleolus with extension into the medial malleolus. There is a 6 mm rectangular bone fragment anterior to the talar dome likely a displaced fracture fragment from the tibial plafond. No dislocation. No significant fluid collection or effusion. Ligaments Suboptimally assessed by CT. Muscles  and Tendons No intramuscular hematoma. Soft tissues There is diffuse subcutaneous edema. No large  fluid collection. A cast is noted. IMPRESSION: 1. Comminuted fracture of the tibial plafond with fractures of the medial and lateral malleoli. 2. Comminuted and displaced fracture of the distal fibula. 3. A small rectangular bone fragment anterior to the talar dome, likely a displaced fracture fragment from the tibial plafond. Electronically Signed   By: Elgie Collard M.D.   On: 01/01/2020 01:34   DG Ankle Right Port  Result Date: 12/31/2019 CLINICAL DATA:  Twisting injury with deformity EXAM: PORTABLE RIGHT ANKLE - 2 VIEW COMPARISON:  None. FINDINGS: Acute comminuted fracture involving the distal shaft of the fibula with moderate posterior and lateral angulation of distal fracture fragment. Posterior dislocation of the talus with respect to the distal tibia. Acute comminuted posterior malleolar fracture. IMPRESSION: 1. Acute comminuted and angulated distal fibular fracture 2. Acute comminuted posterior malleolar fracture. There is posterior dislocation of the talus with respect to the distal tibia Electronically Signed   By: Jasmine Pang M.D.   On: 12/31/2019 22:12    12:39 AM LMTRC with Dr. Linna Caprice.    12:45 AM Discussed with Dr. Linna Caprice who requests CT before discharge.  Pt is to be nonweightbearing and will have follow-up in the office next week.  1:49 AM Patient given oral pain control here in the emergency department.  Will be discharged with crutches.  She is to remain nonweightbearing and will have close follow-up with orthopedics next week.    Ankle dislocation, left, initial encounter  Ankle injury - Plan: DG Ankle Right Port, DG Ankle Right Port  Closed fracture of distal end of right tibia, unspecified fracture morphology, initial encounter  Closed fracture of distal end of right fibula, unspecified fracture morphology, initial encounter      Milta Deiters 01/01/20 0153    Mancel Bale, MD 01/01/20 1031

## 2020-01-18 ENCOUNTER — Other Ambulatory Visit: Payer: Self-pay | Admitting: Obstetrics and Gynecology

## 2020-01-18 ENCOUNTER — Telehealth: Payer: Self-pay

## 2020-01-18 MED ORDER — FLUCONAZOLE 150 MG PO TABS: 150.0000 mg | ORAL_TABLET | Freq: Once | ORAL | 0 refills | Status: AC

## 2020-01-18 NOTE — Telephone Encounter (Signed)
Pt aware.

## 2020-01-18 NOTE — Telephone Encounter (Signed)
Rx diflucan eRxd. CMA to notify pt. RTO prn sx.

## 2020-01-18 NOTE — Progress Notes (Signed)
Rx diflucan for yeast vag after abx use

## 2020-01-18 NOTE — Telephone Encounter (Signed)
Pt calling;recently broke her leg; was given antibx; now has a yeast inf; would like diflucan called in.  [phone]  Pt cannot get a hold of provider who rx'd antibx - they won't call her back.  Has already tried [facility].

## 2020-02-08 ENCOUNTER — Ambulatory Visit (INDEPENDENT_AMBULATORY_CARE_PROVIDER_SITE_OTHER): Payer: 59 | Admitting: Obstetrics and Gynecology

## 2020-02-08 ENCOUNTER — Other Ambulatory Visit: Payer: Self-pay

## 2020-02-08 ENCOUNTER — Encounter: Payer: Self-pay | Admitting: Obstetrics and Gynecology

## 2020-02-08 ENCOUNTER — Other Ambulatory Visit (HOSPITAL_COMMUNITY)
Admission: RE | Admit: 2020-02-08 | Discharge: 2020-02-08 | Disposition: A | Discharge status: Home / Self Care | Payer: PRIVATE HEALTH INSURANCE | Source: Ambulatory Visit | Attending: Obstetrics and Gynecology | Admitting: Obstetrics and Gynecology

## 2020-02-08 VITALS — BP 114/80 | Ht 71.0 in | Wt 164.0 lb

## 2020-02-08 DIAGNOSIS — Z113 Encounter for screening for infections with a predominantly sexual mode of transmission: Secondary | ICD-10-CM | POA: Insufficient documentation

## 2020-02-08 DIAGNOSIS — N898 Other specified noninflammatory disorders of vagina: Secondary | ICD-10-CM | POA: Diagnosis present

## 2020-02-08 LAB — POCT WET PREP WITH KOH
Clue Cells Wet Prep HPF POC: NEGATIVE
KOH Prep POC: NEGATIVE
Trichomonas, UA: NEGATIVE
Yeast Wet Prep HPF POC: NEGATIVE

## 2020-02-08 NOTE — Progress Notes (Signed)
[place] [employer] Complaint  Patient presents with  . Vaginal Discharge    sour odor, no itchiness or irritation x 1 month    HPI:      [name] is a 24 y.o. G0P0000 who LMP was Patient's last menstrual period was 01/25/2020 (approximate)., presents today for increased d/c with sour smell, no vaginal itching, recently. Sx started initially about a month ago with vaginal itching as well but treated with monistat-1 and diflucan with sx relief. Hx of recent abx use for broken ankle. Pt is sex active, no new partners x 9 months. No condom use. Was using xulane. Stopped before ankle fracture surg, hasn't restarted yet. Conception ok in future but not now. Neg STD testing 7/20   Past Medical History:  Diagnosis Date  . Migraine     Past Surgical History:  Procedure Laterality Date  . ANKLE SURGERY  12/2018    Family History  Problem Relation Age of Onset  . Thyroid disease Sister   . Breast cancer Other     Social History   Socioeconomic History  . Marital status: Single    Spouse name: Not on file  . Number of children: 0  . Years of education: 55  . Highest education level: Not on file  Occupational History  . Occupation: Ship broker    Comment: [place]  . Occupation: Administrator, arts    Comment: Publishing copy at [employer]  . Smoking status: Never Smoker  . Smokeless tobacco: Never Used  Substance and Sexual Activity  . Alcohol use: Yes  . Drug use: No  . Sexual activity: Not Currently    Birth control/protection: None  Other Topics Concern  . Not on file  Social History Narrative  . Not on file   Social Determinants of Health   Financial Resource Strain:   . Difficulty of Paying Living Expenses:   Food Insecurity:   . Worried About Charity fundraiser in the Last Year:   . Arboriculturist in the Last Year:   Transportation Needs:   . Film/video editor (Medical):   Marland Kitchen Lack of Transportation (Non-Medical):    Physical Activity:   . Days of Exercise per Week:   . Minutes of Exercise per Session:   Stress:   . Feeling of Stress :   Social Connections:   . Frequency of Communication with Friends and Family:   . Frequency of Social Gatherings with Friends and Family:   . Attends Religious Services:   . Active Member of Clubs or Organizations:   . Attends Archivist Meetings:   Marland Kitchen Marital Status:   Intimate Partner Violence:   . Fear of Current or Ex-Partner:   . Emotionally Abused:   Marland Kitchen Physically Abused:   . Sexually Abused:     Outpatient Medications Prior to Visit  Medication Sig Dispense Refill  . HYDROcodone-acetaminophen (NORCO/VICODIN) 5-325 MG tablet Take 1-2 tablets by mouth every 6 (six) hours as needed. 15 tablet 0  . ibuprofen (ADVIL,MOTRIN) 800 MG tablet Take 1 tablet (800 mg total) by mouth every 8 (eight) hours as needed. 30 tablet 0  . oxyCODONE (OXY IR/ROXICODONE) 5 MG immediate release tablet Take 5 mg by mouth every 6 (six) hours as needed.    . venlafaxine XR (EFFEXOR-XR) 75 MG 24 hr capsule Take 75 mg by mouth every morning.    . norelgestromin-ethinyl estradiol Marilu Favre) 150-35 MCG/24HR transdermal patch Place  1 patch onto the skin once a week. (Patient not taking: Reported on 02/08/2020) 3 patch 12  . benzonatate (TESSALON) 200 MG capsule Take 1 capsule (200 mg total) by mouth 3 (three) times daily as needed for cough. (Patient not taking: Reported on 05/05/2019) 30 capsule 0  . methocarbamol (ROBAXIN) 500 MG tablet Take 1 tablet (500 mg total) by mouth 2 (two) times daily. 20 tablet 0   No facility-administered medications prior to visit.      ROS:  Review of Systems  Constitutional: Negative for fever.  Gastrointestinal: Negative for blood in stool, constipation, diarrhea, nausea and vomiting.  Genitourinary: Positive for vaginal discharge. Negative for dyspareunia, dysuria, flank pain, frequency, hematuria, urgency, vaginal bleeding and vaginal pain.   Musculoskeletal: Negative for back pain.  Skin: Negative for rash.   BREAST: No symptoms   OBJECTIVE:   Vitals:  BP 114/80   Ht 5\' 11"  (1.803 m)   Wt 164 lb (74.4 kg)   LMP 01/25/2020 (Approximate)   BMI 22.87 kg/m   Physical Exam Vitals reviewed.  Constitutional:      Appearance: She is well-developed.  Pulmonary:     Effort: Pulmonary effort is normal.  Genitourinary:    General: Normal vulva.     Pubic Area: No rash.      Labia:        Right: No rash, tenderness or lesion.        Left: No rash, tenderness or lesion.      Vagina: Vaginal discharge present. No erythema or tenderness.     Cervix: Normal.     Uterus: Normal. Not enlarged and not tender.      Adnexa: Right adnexa normal and left adnexa normal.       Right: No mass or tenderness.         Left: No mass or tenderness.    Musculoskeletal:        General: Normal range of motion.     Cervical back: Normal range of motion.  Skin:    General: Skin is warm and dry.  Neurological:     General: No focal deficit present.     Mental Status: She is alert and oriented to person, place, and time.  Psychiatric:        Mood and Affect: Mood normal.        Behavior: Behavior normal.        Thought Content: Thought content normal.        Judgment: Judgment normal.     Results: Results for orders placed or performed in visit on 02/08/20 (from the past 24 hour(s))  POCT Wet Prep with KOH     Status: Normal   Collection Time: 02/08/20  3:25 PM  Result Value Ref Range   Trichomonas, UA Negative    Clue Cells Wet Prep HPF POC neg    Epithelial Wet Prep HPF POC     Yeast Wet Prep HPF POC neg    Bacteria Wet Prep HPF POC     RBC Wet Prep HPF POC     WBC Wet Prep HPF POC     KOH Prep POC Negative Negative     Assessment/Plan: Vaginal discharge - Plan: Cervicovaginal ancillary only, POCT Wet Prep with KOH; Neg wet prep. Normal d/c on vag exam. Check STDs/yeast. Will f/u if pos. If neg, most likely normal d/c. F/u  prn.   Screening for STD (sexually transmitted disease) - Plan: Cervicovaginal ancillary only     Return  if symptoms worsen or fail to improve.  Alicia B. Copland, PA-C 02/08/2020 3:29 PM

## 2020-02-08 NOTE — Patient Instructions (Signed)
I value your feedback and entrusting us with your care. If you get a [facility] patient survey, I would appreciate you taking the time to let us know about your experience today. Thank you!  As of September 30, 2019, your lab results will be released to your MyChart immediately, before I even have a chance to see them. Please give me time to review them and contact you if there are any abnormalities. Thank you for your patience.  

## 2020-02-10 LAB — CERVICOVAGINAL ANCILLARY ONLY
Candida Glabrata: NEGATIVE
Candida Vaginitis: NEGATIVE
Chlamydia: NEGATIVE
Comment: NEGATIVE
Comment: NEGATIVE
Comment: NEGATIVE
Comment: NEGATIVE
Comment: NORMAL
Neisseria Gonorrhea: NEGATIVE
Trichomonas: NEGATIVE

## 2020-02-23 DIAGNOSIS — S82851A Displaced trimalleolar fracture of right lower leg, initial encounter for closed fracture: Secondary | ICD-10-CM | POA: Insufficient documentation

## 2020-07-19 ENCOUNTER — Other Ambulatory Visit: Payer: Self-pay

## 2020-07-19 ENCOUNTER — Encounter: Payer: Self-pay | Admitting: Obstetrics

## 2020-07-19 ENCOUNTER — Ambulatory Visit (INDEPENDENT_AMBULATORY_CARE_PROVIDER_SITE_OTHER): Payer: Self-pay | Admitting: Obstetrics

## 2020-07-19 VITALS — BP 100/70 | HR 68 | Ht 71.0 in | Wt 168.0 lb

## 2020-07-19 DIAGNOSIS — Z3009 Encounter for other general counseling and advice on contraception: Secondary | ICD-10-CM

## 2020-07-19 DIAGNOSIS — N926 Irregular menstruation, unspecified: Secondary | ICD-10-CM

## 2020-07-19 DIAGNOSIS — N939 Abnormal uterine and vaginal bleeding, unspecified: Secondary | ICD-10-CM | POA: Insufficient documentation

## 2020-07-19 DIAGNOSIS — Z23 Encounter for immunization: Secondary | ICD-10-CM

## 2020-07-19 LAB — POCT URINE PREGNANCY: Preg Test, Ur: NEGATIVE

## 2020-07-19 MED ORDER — LO LOESTRIN FE 1 MG-10 MCG / 10 MCG PO TABS: 1.0000 | ORAL_TABLET | Freq: Every day | ORAL | 3 refills | Status: DC

## 2020-07-19 NOTE — Progress Notes (Signed)
Obstetrics & Gynecology Office Visit   Chief Complaint:  Chief Complaint  Patient presents with  . Menstrual Problem    bleed 2wks ago and started again day before last (Monday); would like to go back on the patch,    History of Present Illness: Lorraine Pena is a 24 yo patient who c/o the onset of heavier bleeding midcycle. She reports an LMP  Two weeks ago, and then two days ago starting another bleed. She describes the bleeding as heavier, with no clots, and she denies cramping. She is not presently contracepting. She was using Xulane for several months earlier in the year, but then stopped its use when she had orthopedic surgery.  Her reported use of the patch has been spotty. She acknowledges having some trouble remembering to apply the patches regularly.  Is now in a new relationship of 2 months duration; has not discussed a pregnancy with this BF. Not on any medications or vitamins at present. See PISQ intake.  She would like to have a baby in the next few years.   Review of Systems:  Review of Systems  Constitutional: Negative.   HENT: Negative.   Eyes: Negative.   Respiratory: Negative.   Cardiovascular: Negative.   Gastrointestinal: Negative.   Genitourinary: Negative.   Musculoskeletal: Negative.   Skin: Negative.   Neurological: Negative.   Endo/Heme/Allergies: Negative.   Psychiatric/Behavioral: The patient is nervous/anxious.      Past Medical History:  Past Medical History:  Diagnosis Date  . Migraine     Past Surgical History:  Past Surgical History:  Procedure Laterality Date  . ANKLE SURGERY  12/2018    Gynecologic History: Patient's last menstrual period was 07/17/2020 (exact date).  Obstetric History: G0P0000  Family History:  Family History  Problem Relation Age of Onset  . Thyroid disease Sister   . Breast cancer Other     Social History:  Social History   Socioeconomic History  . Marital status: Single    Spouse name: Not on file  .  Number of children: 0  . Years of education: 52  . Highest education level: Not on file  Occupational History  . Occupation: Consulting civil engineer    Comment: [employer]  . 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 Smoking status: Never Smoker  . Smokeless tobacco: Never Used  Vaping Use  . Vaping Use: Never used  Substance and Sexual Activity  . Alcohol use: Yes  . Drug use: No  . Sexual activity: Yes    Birth control/protection: None  Other Topics Concern  . Not on file  Social History Narrative  . Not on file   Social Determinants of Health   Financial Resource Strain:   . Difficulty of Paying Living Expenses: Not on file  Food Insecurity:   . Worried About Programme researcher, broadcasting/film/video in the Last Year: Not on file  . Ran Out of Food in the Last Year: Not on file  Transportation Needs:   . Lack of Transportation (Medical): Not on file  . Lack of Transportation (Non-Medical): Not on file  Physical Activity:   . Days of Exercise per Week: Not on file  . Minutes of Exercise per Session: Not on file  Stress:   . Feeling of Stress : Not on file  Social Connections:   . Frequency of Communication with Friends and Family: Not on file  . Frequency of Social Gatherings with Friends and Family:  Not on file  . Attends Religious Services: Not on file  . Active Member of Clubs or Organizations: Not on file  . Attends Banker Meetings: Not on file  . Marital Status: Not on file  Intimate Partner Violence:   . Fear of Current or Ex-Partner: Not on file  . Emotionally Abused: Not on file  . Physically Abused: Not on file  . Sexually Abused: Not on file    Allergies:  Allergies  Allergen Reactions  . Morphine And Related Other (See Comments)    Younger sibling had a severe reaction, had a seizure & coded per pt.   . Peanut-Containing Drug Products Itching    Medications: Prior to Admission medications   Medication Sig Start Date End Date Taking?  Authorizing Provider  ibuprofen (ADVIL,MOTRIN) 800 MG tablet Take 1 tablet (800 mg total) by mouth every 8 (eight) hours as needed. 11/30/17  Yes Evon Slack, PA-C  HYDROcodone-acetaminophen (NORCO/VICODIN) 5-325 MG tablet Take 1-2 tablets by mouth every 6 (six) hours as needed. Patient not taking: Reported on 07/19/2020 01/01/20   Muthersbaugh, Dahlia Client, PA-C  norelgestromin-ethinyl estradiol [facility]) 150-35 MCG/24HR transdermal patch Place 1 patch onto the skin once a week. Patient not taking: Reported on 02/08/2020 05/05/19   Oswaldo Conroy, CNM  oxyCODONE (OXY IR/ROXICODONE) 5 MG immediate release tablet Take 5 mg by mouth every 6 (six) hours as needed. Patient not taking: Reported on 07/19/2020 01/11/20   [provider]    Physical Exam Vitals:  Vitals:   07/19/20 1056  BP: 100/70  Pulse: 68   Patient's last menstrual period was 07/17/2020 (exact date).  Physical Exam Constitutional:      Appearance: Normal appearance.  Eyes:     Extraocular Movements: Extraocular movements intact.     Pupils: Pupils are equal, round, and reactive to light.  Cardiovascular:     Rate and Rhythm: Normal rate and regular rhythm.  Pulmonary:     Effort: Pulmonary effort is normal.     Breath sounds: Normal breath sounds.  Genitourinary:    General: Normal vulva.     Comments: No rashes or lesions noted. Moderate vaginal bleeding noted, no clots. No CMT, or pelvic masses or adnexal pain or masses palpated during bimanual. Musculoskeletal:        General: Normal range of motion.  Skin:    General: Skin is warm and dry.  Neurological:     General: No focal deficit present.     Mental Status: She is alert and oriented to person, place, and time.  Psychiatric:        Mood and Affect: Mood normal.        Behavior: Behavior normal.      Assessment: 24 y.o. G0P0000  With abnormal uterine bleeding Irregular use of hormonal contraception. Interest in contraceptive planning. Negative  pregnancy testing. Overdue for annual Gyn physical.   Plan: Problem List Items Addressed This Visit      Genitourinary   Abnormal uterine bleeding (AUB)    Other Visit Diagnoses    Irregular uterine bleeding    -  Primary   Relevant Orders   POCT urine pregnancy (Completed)   Need for diphtheria-tetanus-pertussis (Tdap) vaccine       Relevant Orders   Tdap vaccine greater than or equal to 7yo IM (Completed)   Contraceptive education         Pregnancy testing - negative result. Discussed the importance for a contraceptive plan until she is ready for  a pregnancy. After reviewing options (she declines Depo, Nexplanon, IUD) will proceed with OCPs- sample pack provided. Also discussed the Nuva Ring as option.  AUB- OCPs provided to address the flow.  She is due for a physical and STI testing. Will schedule her to return for a GYN PE in the next few weeks, and to f/u on her AUB.  Mirna Mires, CNM  07/19/2020 1:38 PM

## 2020-08-17 ENCOUNTER — Other Ambulatory Visit (HOSPITAL_COMMUNITY)
Admission: RE | Admit: 2020-08-17 | Discharge: 2020-08-17 | Disposition: A | Discharge status: Home / Self Care | Payer: PRIVATE HEALTH INSURANCE | Source: Ambulatory Visit | Attending: Obstetrics | Admitting: Obstetrics

## 2020-08-17 ENCOUNTER — Other Ambulatory Visit: Payer: Self-pay

## 2020-08-17 ENCOUNTER — Encounter: Payer: Self-pay | Admitting: Obstetrics

## 2020-08-17 ENCOUNTER — Ambulatory Visit (INDEPENDENT_AMBULATORY_CARE_PROVIDER_SITE_OTHER): Payer: PRIVATE HEALTH INSURANCE | Admitting: Obstetrics

## 2020-08-17 VITALS — BP 110/70 | HR 66 | Ht 71.0 in | Wt 168.0 lb

## 2020-08-17 DIAGNOSIS — Z30016 Encounter for initial prescription of transdermal patch hormonal contraceptive device: Secondary | ICD-10-CM | POA: Diagnosis not present

## 2020-08-17 DIAGNOSIS — Z124 Encounter for screening for malignant neoplasm of cervix: Secondary | ICD-10-CM | POA: Insufficient documentation

## 2020-08-17 DIAGNOSIS — Z113 Encounter for screening for infections with a predominantly sexual mode of transmission: Secondary | ICD-10-CM

## 2020-08-17 DIAGNOSIS — Z01419 Encounter for gynecological examination (general) (routine) without abnormal findings: Secondary | ICD-10-CM | POA: Insufficient documentation

## 2020-08-17 LAB — POCT URINE PREGNANCY: Preg Test, Ur: NEGATIVE

## 2020-08-17 MED ORDER — ETONOGESTREL-ETHINYL ESTRADIOL 0.12-0.015 MG/24HR VA RING: VAGINAL_RING | VAGINAL | 11 refills | Status: DC

## 2020-08-17 MED ORDER — NORELGESTROMIN-ETH ESTRADIOL 150-35 MCG/24HR TD PTWK: 1.0000 | MEDICATED_PATCH | TRANSDERMAL | 12 refills | Status: DC

## 2020-08-17 NOTE — Progress Notes (Signed)
Gynecology Annual Exam  PCP: [facility], [facility]  Chief Complaint:  Chief Complaint  Patient presents with  . Gynecologic Exam    wants to get tested for everything; bc;     History of Present Illness:  [name] is a 24 y.o. G0P0000 who LMP was Patient's last menstrual period was 07/31/2020., presents today for her annual examination.  Her menses are regular every 28-30 days, lasting 5 day(s).  Dysmenorrhea none. She does have intermenstrual bleeding.She has been on OCPs admits to missing her pills several times monthy. She would like to return to the Patch. She is single partner, contraception - OCP (estrogen/progesterone) and has sex with males.  Last Pap: 05/11/2019  Results were: no abnormalities /neg HPV DNA  Hx of STDs: none  There is no FH of breast cancer. There is no FH of ovarian cancer. The patient does not do self-breast exams.  Tobacco use: The patient denies current or previous tobacco use. Alcohol use: social drinker Exercise: moderately active    The patient wears seatbelts: yes.   The patient reports that domestic violence in her life is absent.   Past Medical History:  Diagnosis Date  . Migraine     Past Surgical History:  Procedure Laterality Date  . ANKLE SURGERY  12/2018    Prior to Admission medications   Medication Sig Start Date End Date Taking? Authorizing Provider  ibuprofen (ADVIL,MOTRIN) 800 MG tablet Take 1 tablet (800 mg total) by mouth every 8 (eight) hours as needed. 11/30/17  Yes Evon Slack, PA-C  Norethindrone-Ethinyl Estradiol-Fe Biphas (LO LOESTRIN FE) 1 MG-10 MCG / 10 MCG tablet Take 1 tablet by mouth daily. 07/19/20 10/11/20 Yes Mirna Mires, CNM  oxyCODONE (OXY IR/ROXICODONE) 5 MG immediate release tablet Take 5 mg by mouth every 6 (six) hours as needed. Patient not taking: Reported on 07/19/2020 01/11/20   [provider]    Allergies  Allergen Reactions  . Morphine And Related Other (See  Comments)    Younger sibling had a severe reaction, had a seizure & coded per pt.   . Peanut-Containing Drug Products Itching    Gynecologic History: Patient's last menstrual period was 07/31/2020. History of abnormal pap smear: No History of STI: No   Obstetric History: G0P0000  Social History   Socioeconomic History  . Marital status: Single    Spouse name: Not on file  . Number of children: 0  . Years of education: 8  . Highest education level: Not on file  Occupational History  . Occupation: Consulting civil engineer    Comment: [employer]  . 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 Smoking status: Never Smoker  . Smokeless tobacco: Never Used  Vaping Use  . Vaping Use: Never used  Substance and Sexual Activity  . Alcohol use: Yes  . Drug use: No  . Sexual activity: Yes    Birth control/protection: Pill  Other Topics Concern  . Not on file  Social History Narrative  . Not on file   Social Determinants of Health   Financial Resource Strain:   . Difficulty of Paying Living Expenses: Not on file  Food Insecurity:   . Worried About Programme researcher, broadcasting/film/video in the Last Year: Not on file  . Ran Out of Food in the Last Year: Not on file  Transportation Needs:   . Lack of Transportation (Medical): Not on file  . Lack of Transportation (Non-Medical): Not  on file  Physical Activity:   . Days of Exercise per Week: Not on file  . Minutes of Exercise per Session: Not on file  Stress:   . Feeling of Stress : Not on file  Social Connections:   . Frequency of Communication with Friends and Family: Not on file  . Frequency of Social Gatherings with Friends and Family: Not on file  . Attends Religious Services: Not on file  . Active Member of Clubs or Organizations: Not on file  . Attends Banker Meetings: Not on file  . Marital Status: Not on file  Intimate Partner Violence:   . Fear of Current or Ex-Partner: Not on file  . Emotionally Abused:  Not on file  . Physically Abused: Not on file  . Sexually Abused: Not on file    Family History  Problem Relation Age of Onset  . Thyroid disease Sister   . Breast cancer Other     Review of Systems  Constitutional: Negative.   Eyes: Negative.   Respiratory: Negative.   Cardiovascular: Negative.   Gastrointestinal: Negative.   Genitourinary: Negative.   Musculoskeletal: Negative.   Skin: Negative.   Neurological: Negative.   Endo/Heme/Allergies: Negative.   Psychiatric/Behavioral: Negative.      Physical Exam BP 110/70   Pulse 66   Ht 5\' 11"  (1.803 m)   Wt 168 lb (76.2 kg)   LMP 07/31/2020   BMI 23.43 kg/m    Physical Exam Constitutional:      Appearance: Normal appearance. She is normal weight.  Genitourinary:     Vulva and rectum normal.     Genitourinary Comments: Shaves entire escutcheon  HENT:     Head: Normocephalic.     Mouth/Throat:     Mouth: Mucous membranes are moist.     Pharynx: Oropharynx is clear.  Eyes:     Extraocular Movements: Extraocular movements intact.     Pupils: Pupils are equal, round, and reactive to light.  Cardiovascular:     Rate and Rhythm: Normal rate and regular rhythm.     Pulses: Normal pulses.     Heart sounds: Normal heart sounds.  Pulmonary:     Effort: Pulmonary effort is normal.     Breath sounds: Normal breath sounds.  Abdominal:     General: Abdomen is flat. Bowel sounds are normal.     Palpations: Abdomen is soft.  Musculoskeletal:        General: Normal range of motion.     Cervical back: Normal range of motion and neck supple.  Neurological:     General: No focal deficit present.     Mental Status: She is alert and oriented to person, place, and time.  Skin:    General: Skin is warm and dry.  Psychiatric:        Behavior: Behavior normal.     Female chaperone present for pelvic and breast  portions of the physical exam  Results:    Assessment: 24 y.o. G0P0000 female here for routine annual  gynecologic examination  Plan: Problem List Items Addressed This Visit    None    Visit Diagnoses    Women's annual routine gynecological examination    -  Primary   Cervical cancer screening          Screening: -- Blood pressure screen normal -- Weight screening: normal -- Depression screening negative (PHQ-9) -- Nutrition: normal -- cholesterol screening: not due for screening -- osteoporosis screening: not due -- tobacco screening:  not using -- alcohol screening: AUDIT questionnaire indicates low-risk usage. -- family history of breast cancer screening: done. not at high risk. -- no evidence of domestic violence or intimate partner violence. -- STD screening: gonorrhea/chlamydia NAAT collected -- pap smear collected per ASCCP guidelines -- flu vaccine declined -- HPV vaccination series: has not received - pt refuses. We discussed the Gardasil series and it is recommended. She declines Prescription for the generic patch is prescribed for her.  RTC in 12 months.  Mirna Mires, CNM  08/17/2020 2:56 PM     08/17/2020 1:53 PM

## 2020-08-18 LAB — HEP, RPR, HIV PANEL
HIV Screen 4th Generation wRfx: NONREACTIVE
Hepatitis B Surface Ag: NEGATIVE
RPR Ser Ql: NONREACTIVE

## 2020-08-21 LAB — CYTOLOGY - PAP
Chlamydia: NEGATIVE
Comment: NEGATIVE
Comment: NEGATIVE
Comment: NORMAL
Diagnosis: NEGATIVE
Neisseria Gonorrhea: NEGATIVE
Trichomonas: NEGATIVE

## 2020-08-22 ENCOUNTER — Encounter: Payer: Self-pay | Admitting: Obstetrics

## 2020-09-06 ENCOUNTER — Encounter: Payer: Self-pay | Admitting: *Deleted

## 2020-09-06 ENCOUNTER — Other Ambulatory Visit: Payer: Self-pay

## 2020-09-06 ENCOUNTER — Emergency Department
Admission: EM | Admit: 2020-09-06 | Discharge: 2020-09-06 | Disposition: A | Discharge status: Home / Self Care | Payer: 59 | Attending: Emergency Medicine | Admitting: Emergency Medicine

## 2020-09-06 DIAGNOSIS — R0781 Pleurodynia: Secondary | ICD-10-CM | POA: Insufficient documentation

## 2020-09-06 DIAGNOSIS — Z5321 Procedure and treatment not carried out due to patient leaving prior to being seen by health care provider: Secondary | ICD-10-CM | POA: Diagnosis not present

## 2020-09-06 NOTE — ED Triage Notes (Signed)
Pt has left anterior rib pain.  No known injury.  No chest pain or sob.  Sx for 2 weeks.  States pain is worse today.  No cough.  Pt alert speech clear.

## 2020-09-27 ENCOUNTER — Ambulatory Visit: Payer: Self-pay

## 2020-09-27 ENCOUNTER — Encounter: Payer: Self-pay | Admitting: Emergency Medicine

## 2020-09-27 ENCOUNTER — Ambulatory Visit
Admission: EM | Admit: 2020-09-27 | Discharge: 2020-09-27 | Disposition: A | Discharge status: Home / Self Care | Payer: 59 | Attending: Family Medicine | Admitting: Family Medicine

## 2020-09-27 DIAGNOSIS — Z1152 Encounter for screening for COVID-19: Secondary | ICD-10-CM | POA: Insufficient documentation

## 2020-09-27 DIAGNOSIS — N3001 Acute cystitis with hematuria: Secondary | ICD-10-CM | POA: Insufficient documentation

## 2020-09-27 LAB — POCT URINALYSIS DIP (MANUAL ENTRY)
Bilirubin, UA: NEGATIVE
Glucose, UA: NEGATIVE mg/dL
Nitrite, UA: POSITIVE — AB
Protein Ur, POC: 100 mg/dL — AB
Spec Grav, UA: 1.02 (ref 1.010–1.025)
Urobilinogen, UA: 1 E.U./dL
pH, UA: 6 (ref 5.0–8.0)

## 2020-09-27 LAB — POCT URINE PREGNANCY: Preg Test, Ur: NEGATIVE

## 2020-09-27 MED ORDER — SULFAMETHOXAZOLE-TRIMETHOPRIM 800-160 MG PO TABS: 1.0000 | ORAL_TABLET | Freq: Two times a day (BID) | ORAL | 0 refills | Status: AC

## 2020-09-27 MED ORDER — ONDANSETRON 4 MG PO TBDP: 4.0000 mg | ORAL_TABLET | Freq: Three times a day (TID) | ORAL | 0 refills | Status: DC | PRN

## 2020-09-27 NOTE — Discharge Instructions (Signed)
I believe that your symptoms are caused by a urinary tract infection.  Your urine was pretty consistent with that today.  I am also sending this for culture.  We will go ahead and treat with antibiotics today given you Zofran for nausea as needed.  Make sure you are drinking plenty of water and staying hydrated. We also have sent testing for Covid, flu and STD screening.

## 2020-09-27 NOTE — ED Triage Notes (Signed)
Pt has been having nausea/vomiting, fevers, and dizziness. Period was about 2 months ago.  Pt has taken pregnancy tests and COVID that were negative.

## 2020-09-28 NOTE — ED Provider Notes (Signed)
[facility]    CSN: [id number] Arrival date & time: 09/27/20  1417      History   Chief Complaint Chief Complaint  Patient presents with  . Fever  . Nausea    HPI Lorraine Pena is a 24 y.o. female.   Patient is a 24 year old female who presents today with approximate 3 weeks of intermittent nausea, fevers, lower abdominal discomfort, dizziness.  Denies any upper respiratory symptoms.  Has taken 2 at home pregnancy test and Covid testing were negative.  Has not had a menstrual cycle in 2 months.  Did have some intermittent spotting a couple weeks ago.  Some vaginal discharge.  Denies any specific concern for STDs at this time.     Past Medical History:  Diagnosis Date  . Migraine     Patient Active Problem List   Diagnosis Date Noted  . Abnormal uterine bleeding (AUB) 07/19/2020    Past Surgical History:  Procedure Laterality Date  . ANKLE SURGERY  12/2018    OB History    Gravida  0   Para  0   Term  0   Preterm  0   AB  0   Living  0     SAB  0   IAB  0   Ectopic  0   Multiple  0   Live Births  0            Home Medications    Prior to Admission medications   Medication Sig Start Date End Date Taking? Authorizing Provider  ibuprofen (ADVIL,MOTRIN) 800 MG tablet Take 1 tablet (800 mg total) by mouth every 8 (eight) hours as needed. 11/30/17   Evon Slack, PA-C  norelgestromin-ethinyl estradiol (ORTHO EVRA) 150-35 MCG/24HR transdermal patch Place 1 patch onto the skin once a week. 08/17/20   Mirna Mires, CNM  ondansetron (ZOFRAN ODT) 4 MG disintegrating tablet Take 1 tablet (4 mg total) by mouth every 8 (eight) hours as needed for nausea or vomiting. 09/27/20   Dahlia Byes A, NP  sulfamethoxazole-trimethoprim (BACTRIM DS) 800-160 MG tablet Take 1 tablet by mouth 2 (two) times daily for 7 days. 09/27/20 10/04/20  Janace Aris, NP    Family History Family History  Problem Relation Age of Onset  . Thyroid disease  Sister   . Breast cancer Other     Social History Social History   Tobacco Use  . Smoking status: Never Smoker  . Smokeless tobacco: Never Used  Vaping Use  . Vaping Use: Never used  Substance Use Topics  . Alcohol use: Yes  . Drug use: No     Allergies   Morphine and related and Peanut-containing drug products   Review of Systems Review of Systems   Physical Exam Triage Vital Signs ED Triage Vitals [09/27/20 1507]  Enc Vitals Group     BP 111/78     Pulse Rate 94     Resp 18     Temp 98.3 F (36.8 C)     Temp Source Oral     SpO2 98 %     Weight 168 lb (76.2 kg)     Height      Head Circumference      Peak Flow      Pain Score 0     Pain Loc      Pain Edu?      Excl. in GC?    No data found.  Updated Vital Signs BP 111/78 (  BP Location: Left Arm)   Pulse 94   Temp 98.3 F (36.8 C) (Oral)   Resp 18   Wt 168 lb (76.2 kg)   LMP 07/30/2020 (Exact Date)   SpO2 98%   BMI 23.43 kg/m   Visual Acuity Right Eye Distance:   Left Eye Distance:   Bilateral Distance:    Right Eye Near:   Left Eye Near:    Bilateral Near:     Physical Exam Vitals and nursing note reviewed.  Constitutional:      General: She is not in acute distress.    Appearance: Normal appearance. She is not ill-appearing, toxic-appearing or diaphoretic.  HENT:     Head: Normocephalic.     Right Ear: Tympanic membrane and ear canal normal.     Left Ear: Tympanic membrane and ear canal normal.     Nose: Nose normal.     Mouth/Throat:     Pharynx: Oropharynx is clear.  Eyes:     Conjunctiva/sclera: Conjunctivae normal.  Cardiovascular:     Rate and Rhythm: Normal rate and regular rhythm.  Pulmonary:     Effort: Pulmonary effort is normal.     Breath sounds: Normal breath sounds.  Abdominal:     Palpations: Abdomen is soft.     Tenderness: There is abdominal tenderness. There is no right CVA tenderness or left CVA tenderness.     Comments: Generalized lower abdominal  tenderness. No guarding or rebound  Musculoskeletal:        General: Normal range of motion.     Cervical back: Normal range of motion.  Skin:    General: Skin is warm and dry.     Findings: No rash.  Neurological:     Mental Status: She is alert.  Psychiatric:        Mood and Affect: Mood normal.      UC Treatments / Results  Labs (all labs ordered are listed, but only abnormal results are displayed) Labs Reviewed  POCT URINALYSIS DIP (MANUAL ENTRY) - Abnormal; Notable for the following components:      Result Value   Clarity, UA cloudy (*)    Ketones, POC UA small (15) (*)    Blood, UA moderate (*)    Protein Ur, POC =100 (*)    Nitrite, UA Positive (*)    Leukocytes, UA Large (3+) (*)    All other components within normal limits  COVID-19, FLU A+B NAA  URINE CULTURE  POCT URINE PREGNANCY  CERVICOVAGINAL ANCILLARY ONLY    EKG   Radiology No results found.  Procedures Procedures (including critical care time)  Medications Ordered in UC Medications - No data to display  Initial Impression / Assessment and Plan / UC Course  I have reviewed the triage vital signs and the nursing notes.  Pertinent labs & imaging results that were available during my care of the patient were reviewed by me and considered in my medical decision making (see chart for details).     Acute cystitis with hematuria No concern for pyelonephritis at this time This is most likely the cause of her symptoms.  She has positive nitrates, large leuks and moderate blood on her urinalysis.  We will send for culture. Treating with Bactrim.  We will have her push fluids.  Zofran for nausea, vomiting as needed. Also sending self swab for testing and Covid and flu testing pending. Follow up as needed for continued or worsening symptoms  Final Clinical Impressions(s) / UC Diagnoses  Final diagnoses:  Acute cystitis with hematuria     Discharge Instructions     I believe that your symptoms  are caused by a urinary tract infection.  Your urine was pretty consistent with that today.  I am also sending this for culture.  We will go ahead and treat with antibiotics today given you Zofran for nausea as needed.  Make sure you are drinking plenty of water and staying hydrated. We also have sent testing for Covid, flu and STD screening.    ED Prescriptions    Medication Sig Dispense Auth. Provider   ondansetron (ZOFRAN ODT) 4 MG disintegrating tablet Take 1 tablet (4 mg total) by mouth every 8 (eight) hours as needed for nausea or vomiting. 20 tablet Amani Marseille A, NP   sulfamethoxazole-trimethoprim (BACTRIM DS) 800-160 MG tablet Take 1 tablet by mouth 2 (two) times daily for 7 days. 14 tablet Zenovia Justman A, NP     PDMP not reviewed this encounter.   Janace Aris, NP 09/28/20 [phone]

## 2020-09-29 LAB — CERVICOVAGINAL ANCILLARY ONLY
Bacterial Vaginitis (gardnerella): NEGATIVE
Chlamydia: NEGATIVE
Comment: NEGATIVE
Comment: NEGATIVE
Comment: NEGATIVE
Comment: NORMAL
Neisseria Gonorrhea: NEGATIVE
Trichomonas: NEGATIVE

## 2020-09-30 LAB — URINE CULTURE: Culture: >=100000 — AB

## 2020-09-30 LAB — COVID-19, FLU A+B NAA
Influenza A, NAA: NOT DETECTED
Influenza B, NAA: NOT DETECTED
SARS-CoV-2, NAA: NOT DETECTED

## 2020-11-20 ENCOUNTER — Ambulatory Visit: Payer: Self-pay | Admitting: Obstetrics and Gynecology

## 2020-11-20 NOTE — Progress Notes (Deleted)
    Patient, No Pcp Per   No chief complaint on file.   HPI:      [name] is a 25 y.o. G0P0000 whose LMP was No LMP recorded., presents today for ***  Was on xulane with BTB 9/21 due to stop/start patch use for surgery. Changed to OCPs, then back to xulane at 10/21 annual;  Neg STD testing 10/21 and 12/21  Past Medical History:  Diagnosis Date  . Migraine     Past Surgical History:  Procedure Laterality Date  . ANKLE SURGERY  12/2018    Family History  Problem Relation Age of Onset  . Thyroid disease Sister   . Breast cancer Other     Social History   Socioeconomic History  . Marital status: Single    Spouse name: Not on file  . Number of children: 0  . Years of education: 32  . Highest education level: Not on file  Occupational History  . Occupation: Consulting civil engineer    Comment: [employer]  . 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 Smoking status: Never Smoker  . Smokeless tobacco: Never Used  Vaping Use  . Vaping Use: Never used  Substance and Sexual Activity  . Alcohol use: Yes  . Drug use: No  . Sexual activity: Yes    Birth control/protection: Pill  Other Topics Concern  . Not on file  Social History Narrative  . Not on file   Social Determinants of Health   Financial Resource Strain: Not on file  Food Insecurity: Not on file  Transportation Needs: Not on file  Physical Activity: Not on file  Stress: Not on file  Social Connections: Not on file  Intimate Partner Violence: Not on file    Outpatient Medications Prior to Visit  Medication Sig Dispense Refill  . ibuprofen (ADVIL,MOTRIN) 800 MG tablet Take 1 tablet (800 mg total) by mouth every 8 (eight) hours as needed. 30 tablet 0  . norelgestromin-ethinyl estradiol (ORTHO EVRA) 150-35 MCG/24HR transdermal patch Place 1 patch onto the skin once a week. 3 patch 12  . ondansetron (ZOFRAN ODT) 4 MG disintegrating tablet Take 1 tablet (4 mg total) by mouth  every 8 (eight) hours as needed for nausea or vomiting. 20 tablet 0   No facility-administered medications prior to visit.      ROS:  Review of Systems BREAST: No symptoms   OBJECTIVE:   Vitals:  There were no vitals taken for this visit.  Physical Exam  Results: No results found for this or any previous visit (from the past 24 hour(s)).   Assessment/Plan: No diagnosis found.    No orders of the defined types were placed in this encounter.     No follow-ups on file.  Reiley Keisler B. Letha Mirabal, PA-C 11/20/2020 10:54 AM

## 2020-11-22 NOTE — Progress Notes (Signed)
Patient, No Pcp Per   Chief Complaint  Patient presents with  . STD testing    External lump in vag area, tender at first    HPI:      Lorraine Pena is a 25 y.o. G0P0000 whose LMP was Patient's last menstrual period was 11/13/2020 (approximate)., presents today for eval of vaginla bump for past wk. Tender at first but getting better now. Did not drain. Pt shaves.  Would like full STD testing. Recent partner unfaithful.  No vag sx. No hx of STDs. Not currently sex active. Getting ready to restart xulane since just had menses.  Neg STD testing 10/21 and 12/21  Past Medical History:  Diagnosis Date  . Migraine     Past Surgical History:  Procedure Laterality Date  . ANKLE SURGERY  12/2018    Family History  Problem Relation Age of Onset  . Thyroid disease Sister   . Breast cancer Other        late years    Social History   Socioeconomic History  . Marital status: Single    Spouse name: Not on file  . Number of children: 0  . Years of education: 53  . Highest education level: Not on file  Occupational History  . Occupation: Consulting civil engineer    Comment: [employer]  . 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 Smoking status: Never Smoker  . Smokeless tobacco: Never Used  Vaping Use  . Vaping Use: Never used  Substance and Sexual Activity  . Alcohol use: Yes  . Drug use: No  . Sexual activity: Yes    Birth control/protection: None  Other Topics Concern  . Not on file  Social History Narrative  . Not on file   Social Determinants of Health   Financial Resource Strain: Not on file  Food Insecurity: Not on file  Transportation Needs: Not on file  Physical Activity: Not on file  Stress: Not on file  Social Connections: Not on file  Intimate Partner Violence: Not on file    Outpatient Medications Prior to Visit  Medication Sig Dispense Refill  . norelgestromin-ethinyl estradiol (ORTHO EVRA) 150-35 MCG/24HR transdermal  patch Place 1 patch onto the skin once a week. (Patient not taking: Reported on 11/23/2020) 3 patch 12  . ibuprofen (ADVIL,MOTRIN) 800 MG tablet Take 1 tablet (800 mg total) by mouth every 8 (eight) hours as needed. 30 tablet 0  . ondansetron (ZOFRAN ODT) 4 MG disintegrating tablet Take 1 tablet (4 mg total) by mouth every 8 (eight) hours as needed for nausea or vomiting. 20 tablet 0   No facility-administered medications prior to visit.      ROS:  Review of Systems  Constitutional: Negative for fever, malaise/fatigue and weight loss.  Gastrointestinal: Negative for blood in stool, constipation, diarrhea, nausea and vomiting.  Genitourinary: Positive for genital sores. Negative for dyspareunia, dysuria, flank pain, frequency, hematuria, urgency, vaginal bleeding, vaginal discharge and vaginal pain.  Musculoskeletal: Negative for back pain.  Skin: Negative for itching and rash.    OBJECTIVE:   Vitals:  BP 100/70   Ht 5\' 11"  (1.803 m)   Wt 167 lb (75.8 kg)   LMP 11/13/2020 (Approximate)   BMI 23.29 kg/m   Physical Exam Vitals reviewed.  Constitutional:      Appearance: She is well-developed.  Pulmonary:     Effort: Pulmonary effort is normal.  Genitourinary:    General: Normal vulva.  Pubic Area: No rash.      Labia:        Right: No rash, tenderness or lesion.        Left: No rash, tenderness or lesion.      Vagina: Normal. No vaginal discharge, erythema or tenderness.     Cervix: Normal.     Uterus: Normal. Not enlarged and not tender.      Adnexa: Right adnexa normal and left adnexa normal.       Right: No mass or tenderness.         Left: No mass or tenderness.      Musculoskeletal:        General: Normal range of motion.     Cervical back: Normal range of motion.  Skin:    General: Skin is warm and dry.  Neurological:     General: No focal deficit present.     Mental Status: She is alert and oriented to person, place, and time.  Psychiatric:        Mood  and Affect: Mood normal.        Behavior: Behavior normal.        Thought Content: Thought content normal.        Judgment: Judgment normal.     Assessment/Plan: Vaginal lesion--resolving. Most likely ingrown hair vs pimple. Reassurance. F/u prn.   Screening for STD (sexually transmitted disease) - Plan: HIV Antibody (routine testing w rflx), RPR, HSV 2 antibody, IgG, Hepatitis C antibody, Cervicovaginal ancillary only  Exposure to STD - Plan: HIV Antibody (routine testing w rflx), RPR, HSV 2 antibody, IgG, Hepatitis C antibody, Cervicovaginal ancillary only    Return if symptoms worsen or fail to improve.  Lorraine Crago B. Missie Gehrig, PA-C 11/23/2020 9:17 AM

## 2020-11-23 ENCOUNTER — Other Ambulatory Visit (HOSPITAL_COMMUNITY)
Admission: RE | Admit: 2020-11-23 | Discharge: 2020-11-23 | Disposition: A | Discharge status: Home / Self Care | Payer: PRIVATE HEALTH INSURANCE | Source: Ambulatory Visit | Attending: Obstetrics and Gynecology | Admitting: Obstetrics and Gynecology

## 2020-11-23 ENCOUNTER — Ambulatory Visit (INDEPENDENT_AMBULATORY_CARE_PROVIDER_SITE_OTHER): Payer: PRIVATE HEALTH INSURANCE | Admitting: Obstetrics and Gynecology

## 2020-11-23 ENCOUNTER — Encounter: Payer: Self-pay | Admitting: Obstetrics and Gynecology

## 2020-11-23 ENCOUNTER — Other Ambulatory Visit: Payer: Self-pay

## 2020-11-23 VITALS — BP 100/70 | Ht 71.0 in | Wt 167.0 lb

## 2020-11-23 DIAGNOSIS — N898 Other specified noninflammatory disorders of vagina: Secondary | ICD-10-CM

## 2020-11-23 DIAGNOSIS — Z113 Encounter for screening for infections with a predominantly sexual mode of transmission: Secondary | ICD-10-CM | POA: Insufficient documentation

## 2020-11-23 DIAGNOSIS — Z202 Contact with and (suspected) exposure to infections with a predominantly sexual mode of transmission: Secondary | ICD-10-CM | POA: Diagnosis present

## 2020-11-23 NOTE — Patient Instructions (Signed)
I value your feedback and you entrusting us with your care. If you get a [facility] patient survey, I would appreciate you taking the time to let us know about your experience today. Thank you! ? ? ?

## 2020-11-24 LAB — CERVICOVAGINAL ANCILLARY ONLY
Chlamydia: NEGATIVE
Comment: NEGATIVE
Comment: NEGATIVE
Comment: NORMAL
Neisseria Gonorrhea: NEGATIVE
Trichomonas: NEGATIVE

## 2020-11-24 LAB — HSV 2 ANTIBODY, IGG: HSV 2 IgG, Type Spec: <0.91 index (ref 0.00–0.90)

## 2020-11-24 LAB — HEPATITIS C ANTIBODY: Hep C Virus Ab: <0.1 s/co ratio (ref 0.0–0.9)

## 2020-11-24 LAB — RPR: RPR Ser Ql: NONREACTIVE

## 2020-11-24 LAB — HIV ANTIBODY (ROUTINE TESTING W REFLEX): HIV Screen 4th Generation wRfx: NONREACTIVE

## 2020-12-15 ENCOUNTER — Telehealth: Payer: Self-pay

## 2020-12-15 ENCOUNTER — Other Ambulatory Visit: Payer: Self-pay | Admitting: Obstetrics and Gynecology

## 2020-12-15 DIAGNOSIS — N390 Urinary tract infection, site not specified: Secondary | ICD-10-CM

## 2020-12-15 DIAGNOSIS — N3001 Acute cystitis with hematuria: Secondary | ICD-10-CM

## 2020-12-15 DIAGNOSIS — R300 Dysuria: Secondary | ICD-10-CM

## 2020-12-15 MED ORDER — NITROFURANTOIN MONOHYD MACRO 100 MG PO CAPS: 100.0000 mg | ORAL_CAPSULE | Freq: Two times a day (BID) | ORAL | 0 refills | Status: AC

## 2020-12-15 NOTE — Telephone Encounter (Signed)
Spoke with pt. Having urinary frequency and urgency, occas dysuria. No LBP, fevers, pelvic pain, hematuria. Rx macrobid eRxd, tried AZO. Had UTI 12/21 and then pyelo 1/22 (went to [place] but I don't see notes in chart). This is 3rd UTI since 12/21. Will refer to urology. Sex may be trigger. Voiding before and after, taking shower. Still hasn't started period this month, was to start xulane with menses. No sex for a few wks. Take first AM UPT this Sun. If neg, start xulane. Condoms. F/u prn.

## 2020-12-15 NOTE — Progress Notes (Signed)
Rx macrobid for UTI sx.  

## 2020-12-15 NOTE — Telephone Encounter (Signed)
Pt calling; for the past 2hrs has had UTIs back to back; they're turning into kidney inf; can't keep doing this.  Please call.  [phone]

## 2021-07-02 ENCOUNTER — Ambulatory Visit (INDEPENDENT_AMBULATORY_CARE_PROVIDER_SITE_OTHER): Payer: PRIVATE HEALTH INSURANCE | Admitting: Obstetrics and Gynecology

## 2021-07-02 ENCOUNTER — Other Ambulatory Visit: Payer: Self-pay

## 2021-07-02 ENCOUNTER — Encounter: Payer: Self-pay | Admitting: Obstetrics and Gynecology

## 2021-07-02 ENCOUNTER — Other Ambulatory Visit (HOSPITAL_COMMUNITY)
Admission: RE | Admit: 2021-07-02 | Discharge: 2021-07-02 | Disposition: A | Discharge status: Home / Self Care | Payer: 59 | Source: Ambulatory Visit | Attending: Obstetrics and Gynecology | Admitting: Obstetrics and Gynecology

## 2021-07-02 VITALS — BP 110/70 | Ht 71.0 in | Wt 171.0 lb

## 2021-07-02 DIAGNOSIS — Z113 Encounter for screening for infections with a predominantly sexual mode of transmission: Secondary | ICD-10-CM

## 2021-07-02 NOTE — Progress Notes (Signed)
Patient, No Pcp Per (Inactive)   Chief Complaint  Patient presents with   STD testing    HPI:      [name] is a 25 y.o. G0P0000 whose LMP was Patient's last menstrual period was 06/18/2021 (approximate)., presents today for full STD testing. No known exposures, just wants to be safe. No vag sx. She had neg full STD testing 2/22 but has new partner. Uses condoms every time, also in on xulane for [facility]. Has annual 10/22.   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[employer]  Tobacco Use   Smoking status: Never   Smokeless tobacco: Never  Vaping Use   Vaping Use: Never used  Substance and Sexual Activity   Alcohol use: Yes   Drug use: No   Sexual activity: Yes    Birth control/protection: None, Condom  Other Topics Concern   Not on file  Social History Narrative   Not on file   Social Determinants of Health   Financial Resource Strain: Not on file  Food Insecurity: Not on file  Transportation Needs: Not on file  Physical Activity: Not on file  Stress: Not on file  Social Connections: Not on file  Intimate Partner Violence: Not on file    Outpatient Medications Prior to Visit  Medication Sig Dispense Refill   venlafaxine XR (EFFEXOR-XR) 150 MG 24 hr capsule Take 150 mg by mouth daily.     norelgestromin-ethinyl estradiol (ORTHO EVRA) 150-35 MCG/24HR transdermal patch Place 1 patch onto the skin once a week. (Patient not taking: No sig  reported) 3 patch 12   No facility-administered medications prior to visit.      ROS:  Review of Systems  Constitutional:  Negative for fever.  Gastrointestinal:  Negative for blood in stool, constipation, diarrhea, nausea and vomiting.  Genitourinary:  Negative for dyspareunia, dysuria, flank pain, frequency, hematuria, urgency, vaginal bleeding, vaginal discharge and vaginal pain.  Musculoskeletal:  Negative for back pain.  Skin:  Negative for rash.  BREAST: No symptoms   OBJECTIVE:   Vitals:  BP 110/70   Ht 5\' 11"  (1.803 m)   Wt 171 lb (77.6 kg)   LMP 06/18/2021 (Approximate)   BMI 23.85 kg/m   Physical Exam Vitals reviewed.  Constitutional:      Appearance: She is well-developed.  Pulmonary:     Effort: Pulmonary effort is normal.  Genitourinary:    General: Normal vulva.     Pubic Area: No rash.      Labia:        Right: No rash, tenderness or lesion.        Left: No rash, tenderness or lesion.      Vagina: Normal. No vaginal discharge, erythema or tenderness.     Cervix: Normal.     Uterus: Normal. Not  enlarged and not tender.      Adnexa: Right adnexa normal and left adnexa normal.       Right: No mass or tenderness.         Left: No mass or tenderness.    Musculoskeletal:        General: Normal range of motion.     Cervical back: Normal range of motion.  Skin:    General: Skin is warm and dry.  Neurological:     General: No focal deficit present.     Mental Status: She is alert and oriented to person, place, and time.  Psychiatric:        Mood and Affect: Mood normal.        Behavior: Behavior normal.        Thought Content: Thought content normal.        Judgment: Judgment normal.    Assessment/Plan: Screening for STD (sexually transmitted disease) - Plan: HIV Antibody (routine testing w rflx), RPR, HSV 2 antibody, IgG, Hepatitis C antibody, Cervicovaginal ancillary only    Return if symptoms worsen or fail to improve.  Sergei Delo B. Lourine Alberico,  PA-C 07/02/2021 9:18 AM

## 2021-07-03 LAB — CERVICOVAGINAL ANCILLARY ONLY
Chlamydia: NEGATIVE
Comment: NEGATIVE
Comment: NEGATIVE
Comment: NORMAL
Neisseria Gonorrhea: NEGATIVE
Trichomonas: NEGATIVE

## 2021-07-05 LAB — HSV 2 ANTIBODY, IGG: HSV 2 IgG, Type Spec: <0.91 index (ref 0.00–0.90)

## 2021-07-05 LAB — HEPATITIS C ANTIBODY: Hep C Virus Ab: <0.1 s/co ratio (ref 0.0–0.9)

## 2021-07-05 LAB — RPR: RPR Ser Ql: NONREACTIVE

## 2021-07-05 LAB — HIV ANTIBODY (ROUTINE TESTING W REFLEX): HIV Screen 4th Generation wRfx: NONREACTIVE

## 2021-08-16 NOTE — Progress Notes (Deleted)
   PCP:  Patient, No Pcp Per (Inactive)   No chief complaint on file.    HPI:      [name] is a 25 y.o. G0P0000 whose LMP was No LMP recorded. (Menstrual status: Irregular Periods)., presents today for her annual examination.  Her menses are {norm/abn:715}, lasting {number: 22536} days.  Dysmenorrhea {dysmen:716}. She {does:18564} have intermenstrual bleeding.  Sex activity: {sex active: 315163}.  Last Pap: 08/17/20 Results were: no abnormalities /neg HPV DNA  Hx of STDs: {STD hx:14358} neg SETD testing 9/22   There is no FH of breast cancer. There is no FH of ovarian cancer. The patient {does:18564} do self-breast exams.  Tobacco use: {tob:20664} Alcohol use: {Alcohol:11675} No drug use.  Exercise: {exercise:31265}  She {does:18564} get adequate calcium and Vitamin D in her diet.  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[employer]  Tobacco Use   Smoking status: Never   Smokeless tobacco: Never  Vaping Use   Vaping Use: Never used  Substance and Sexual Activity   Alcohol use: Yes   Drug use: No   Sexual activity: Yes    Birth control/protection: None, Condom  Other Topics Concern   Not on file  Social History Narrative   Not on file   Social Determinants of Health   Financial Resource Strain: Not on file  Food Insecurity: Not on file  Transportation Needs: Not on file  Physical Activity: Not on file  Stress: Not on file  Social  Connections: Not on file  Intimate Partner Violence: Not on file     Current Outpatient Medications:    norelgestromin-ethinyl estradiol (ORTHO EVRA) 150-35 MCG/24HR transdermal patch, Place 1 patch onto the skin once a week. (Patient not taking: No sig reported), Disp: 3 patch, Rfl: 12   venlafaxine XR (EFFEXOR-XR) 150 MG 24 hr capsule, Take 150 mg by mouth daily., Disp: , Rfl:      ROS:  Review of Systems BREAST: No symptoms   Objective: There were no vitals taken for this visit.   OBGyn Exam  Results: No results found for this or any previous visit (from the past 24 hour(s)).  Assessment/Plan: No diagnosis found.  No orders of the defined types were placed in this encounter.            GYN counsel {counseling: 16159}     F/U  No follow-ups on file.  Hanh Kertesz B. Prentice Sackrider, PA-C 08/16/2021 4:46 PM

## 2021-08-20 ENCOUNTER — Ambulatory Visit: Payer: PRIVATE HEALTH INSURANCE | Admitting: Obstetrics and Gynecology

## 2021-09-03 ENCOUNTER — Ambulatory Visit: Payer: PRIVATE HEALTH INSURANCE | Admitting: Obstetrics and Gynecology

## 2021-09-03 NOTE — Progress Notes (Deleted)
   PCP:  Patient, No Pcp Per (Inactive)   No chief complaint on file.    HPI:      Lorraine Pena is a 25 y.o. G0P0000 whose LMP was No LMP recorded. (Menstrual status: Irregular Periods)., presents today for her annual examination.  Her menses are {norm/abn:715}, lasting {number: 22536} days.  Dysmenorrhea {dysmen:716}. She {does:18564} have intermenstrual bleeding.  Sex activity: single partner, contraception - Ortho-Evra patches weekly.  Last Pap: 08/17/20  Results were: no abnormalities  Hx of STDs: none neg STD testing 9/22  There is no FH of breast cancer. There is no FH of ovarian cancer. The patient {does:18564} do self-breast exams.  Tobacco use: {tob:20664} Alcohol use: {Alcohol:11675} No drug use.  Exercise: {exercise:31265}  She {does:18564} get adequate calcium and Vitamin D in her diet.  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[employer]  Tobacco Use   Smoking status: Never   Smokeless tobacco: Never  Vaping Use   Vaping Use: Never used  Substance and Sexual Activity   Alcohol use: Yes   Drug use: No   Sexual activity: Yes    Birth control/protection: None, Condom  Other Topics Concern   Not on file  Social History Narrative   Not on file   Social Determinants of Health   Financial Resource Strain: Not on file  Food Insecurity: Not on file  Transportation Needs: Not on file  Physical  Activity: Not on file  Stress: Not on file  Social Connections: Not on file  Intimate Partner Violence: Not on file     Current Outpatient Medications:    norelgestromin-ethinyl estradiol (ORTHO EVRA) 150-35 MCG/24HR transdermal patch, Place 1 patch onto the skin once a week. (Patient not taking: No sig reported), Disp: 3 patch, Rfl: 12   venlafaxine XR (EFFEXOR-XR) 150 MG 24 hr capsule, Take 150 mg by mouth daily., Disp: , Rfl:      ROS:  Review of Systems BREAST: No symptoms   Objective: There were no vitals taken for this visit.   OBGyn Exam  Results: No results found for this or any previous visit (from the past 24 hour(s)).  Assessment/Plan: No diagnosis found.  No orders of the defined types were placed in this encounter.            GYN counsel {counseling: 16159}     F/U  No follow-ups on file.  Lorraine Horgan B. Johncharles Fusselman, PA-C 09/03/2021 11:40 AM

## 2021-10-03 ENCOUNTER — Other Ambulatory Visit (HOSPITAL_COMMUNITY)
Admission: RE | Admit: 2021-10-03 | Discharge: 2021-10-03 | Disposition: A | Discharge status: Home / Self Care | Payer: PRIVATE HEALTH INSURANCE | Source: Ambulatory Visit | Attending: Obstetrics and Gynecology | Admitting: Obstetrics and Gynecology

## 2021-10-03 ENCOUNTER — Other Ambulatory Visit: Payer: Self-pay

## 2021-10-03 ENCOUNTER — Ambulatory Visit (INDEPENDENT_AMBULATORY_CARE_PROVIDER_SITE_OTHER): Payer: PRIVATE HEALTH INSURANCE | Admitting: Obstetrics and Gynecology

## 2021-10-03 ENCOUNTER — Encounter: Payer: Self-pay | Admitting: Obstetrics and Gynecology

## 2021-10-03 VITALS — BP 100/74 | Ht 71.0 in | Wt 180.0 lb

## 2021-10-03 DIAGNOSIS — F32A Depression, unspecified: Secondary | ICD-10-CM

## 2021-10-03 DIAGNOSIS — Z113 Encounter for screening for infections with a predominantly sexual mode of transmission: Secondary | ICD-10-CM | POA: Insufficient documentation

## 2021-10-03 DIAGNOSIS — Z30011 Encounter for initial prescription of contraceptive pills: Secondary | ICD-10-CM | POA: Diagnosis not present

## 2021-10-03 DIAGNOSIS — F419 Anxiety disorder, unspecified: Secondary | ICD-10-CM | POA: Diagnosis not present

## 2021-10-03 DIAGNOSIS — Z01419 Encounter for gynecological examination (general) (routine) without abnormal findings: Secondary | ICD-10-CM | POA: Diagnosis not present

## 2021-10-03 MED ORDER — DROSPIRENONE-ETHINYL ESTRADIOL 3-0.02 MG PO TABS: 1.0000 | ORAL_TABLET | Freq: Every day | ORAL | 3 refills | Status: DC

## 2021-10-03 NOTE — Progress Notes (Signed)
PCP:  Patient, No Pcp Per (Inactive)   Chief Complaint  Patient presents with   Gynecologic Exam    No concerns     HPI:      [name] is a 25 y.o. G0P0000 whose LMP was Patient's last menstrual period was 09/20/2021 (approximate)., presents today for her annual examination.  Her menses were monthly on xulane patch. Stopped a couple months ago. Took 2 months to cycle again, but started 09/20/21 and lasted 5-6 days.   Sex activity: single partner, contraception - condoms. No sex activity since LMP. Would like to try different BC. Had mood changes with xulane. Already on effexor and feels better off xulane. Did OCPs in past but forgot pills, would like to try again (has more regular schedule now)   Last Pap: 08/17/20  Results were: no abnormalities  Hx of STDs: none; neg STD testing 9/22 but has new partner.   There is no FH of breast cancer. There is no FH of ovarian cancer. The patient does do self-breast exams.  Tobacco use: The patient denies current or previous tobacco use. Alcohol use: none No drug use.  Exercise: not active  She does not get adequate calcium and Vitamin D in her diet.  Patient Active Problem List   Diagnosis Date Noted   Anxiety and depression 10/03/2021   Abnormal uterine bleeding (AUB) 07/19/2020    Past Surgical History:  Procedure Laterality Date   ANKLE SURGERY  12/2018    Family History  Problem Relation Age of Onset   Thyroid disease Sister    Breast cancer Other        late years    Social History   Socioeconomic History   Marital status: Single    Spouse name: Not on file   Number of children: 0   Years of education: 13   Highest education level: Not on file  Occupational History   Occupation: Consulting civil engineer    Comment: [employer]   Occupation: Physicist, 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[employer]  Tobacco Use   Smoking status: Never   Smokeless tobacco: Never  Vaping Use   Vaping Use: Never used  Substance and Sexual  Activity   Alcohol use: Yes   Drug use: No   Sexual activity: Yes    Birth control/protection: None, Condom  Other Topics Concern   Not on file  Social History Narrative   Not on file   Social Determinants of Health   Financial Resource Strain: Not on file  Food Insecurity: Not on file  Transportation Needs: Not on file  Physical Activity: Not on file  Stress: Not on file  Social Connections: Not on file  Intimate Partner Violence: Not on file     Current Outpatient Medications:    drospirenone-ethinyl estradiol (YAZ) 3-0.02 MG tablet, Take 1 tablet by mouth daily., Disp: 84 tablet, Rfl: 3   venlafaxine XR (EFFEXOR-XR) 150 MG 24 hr capsule, Take 150 mg by mouth daily., Disp: , Rfl:      ROS:  Review of Systems  Constitutional:  Positive for fatigue. Negative for fever and unexpected weight change.  Respiratory:  Negative for cough, shortness of breath and wheezing.   Cardiovascular:  Negative for chest pain, palpitations and leg swelling.  Gastrointestinal:  Negative for blood in stool, constipation, diarrhea, nausea and vomiting.  Endocrine: Negative for cold intolerance, heat intolerance and polyuria.  Genitourinary:  Negative for dyspareunia, dysuria, flank pain, frequency, genital sores, hematuria, menstrual  problem, pelvic pain, urgency, vaginal bleeding, vaginal discharge and vaginal pain.  Musculoskeletal:  Negative for back pain, joint swelling and myalgias.  Skin:  Negative for rash.  Neurological:  Negative for dizziness, syncope, light-headedness, numbness and headaches.  Hematological:  Negative for adenopathy.  Psychiatric/Behavioral:  Positive for agitation and dysphoric mood. Negative for confusion, sleep disturbance and suicidal ideas. The patient is not nervous/anxious.   BREAST: No symptoms   Objective: BP 100/74    Ht 5\' 11"  (1.803 m)    Wt 180 lb (81.6 kg)    LMP 09/20/2021 (Approximate)    BMI 25.10 kg/m    Physical Exam Constitutional:       Appearance: She is well-developed.  Genitourinary:     Vulva normal.     Right Labia: No rash, tenderness or lesions.    Left Labia: No tenderness, lesions or rash.    No vaginal discharge, erythema or tenderness.      Right Adnexa: not tender and no mass present.    Left Adnexa: not tender and no mass present.    No cervical friability or polyp.     Uterus is not enlarged or tender.  Breasts:    Right: No mass, nipple discharge, skin change or tenderness.     Left: No mass, nipple discharge, skin change or tenderness.  Neck:     Thyroid: No thyromegaly.  Cardiovascular:     Rate and Rhythm: Normal rate and regular rhythm.     Heart sounds: Normal heart sounds. No murmur heard. Pulmonary:     Effort: Pulmonary effort is normal.     Breath sounds: Normal breath sounds.  Abdominal:     Palpations: Abdomen is soft.     Tenderness: There is no abdominal tenderness. There is no guarding or rebound.  Musculoskeletal:        General: Normal range of motion.     Cervical back: Normal range of motion.  Lymphadenopathy:     Cervical: No cervical adenopathy.  Neurological:     General: No focal deficit present.     Mental Status: She is alert and oriented to person, place, and time.     Cranial Nerves: No cranial nerve deficit.  Skin:    General: Skin is warm and dry.  Psychiatric:        Mood and Affect: Mood normal.        Behavior: Behavior normal.        Thought Content: Thought content normal.        Judgment: Judgment normal.  Vitals reviewed.    Assessment/Plan: Encounter for annual routine gynecological examination  Screening for STD (sexually transmitted disease) - Plan: Cervicovaginal ancillary only  Encounter for initial prescription of contraceptive pills - Plan: drospirenone-ethinyl estradiol (YAZ) 3-0.02 MG tablet; OCP start today, condoms for 1 mo. Rx eRxd. F/u prn.   Anxiety and depression--see if yaz affects sx. Increase exercise, improved dietary intake of  healthy foods. Cont to f/u with therapist.  Meds ordered this encounter  Medications   drospirenone-ethinyl estradiol (YAZ) 3-0.02 MG tablet    Sig: Take 1 tablet by mouth daily.    Dispense:  84 tablet    Refill:  3    Order Specific Question:   Supervising Provider    Answer:   Gae Dry [id number]              GYN counsel use and side effects of OCP's, adequate intake of calcium and vitamin D, diet and exercise  F/U  Return in about 1 year (around 10/03/2022).  Tyneshia Stivers B. Quinnlyn Hearns, PA-C 10/03/2021 2:06 PM

## 2021-10-03 NOTE — Patient Instructions (Signed)
I value your feedback and you entrusting us with your care. If you get a [place] patient survey, I would appreciate you taking the time to let us know about your experience today. Thank you! ? ? ?

## 2021-10-05 LAB — CERVICOVAGINAL ANCILLARY ONLY
Chlamydia: NEGATIVE
Comment: NEGATIVE
Comment: NORMAL
Neisseria Gonorrhea: NEGATIVE

## 2021-11-15 IMAGING — DX DG ANKLE PORT 2V*R*
2 series · 2 of 2 positions shown · non-contrast
Comparison: None.

CLINICAL DATA: Twisting injury with deformity

EXAM:
PORTABLE RIGHT ANKLE - 2 VIEW

[abdomen kub (1 of 2)]
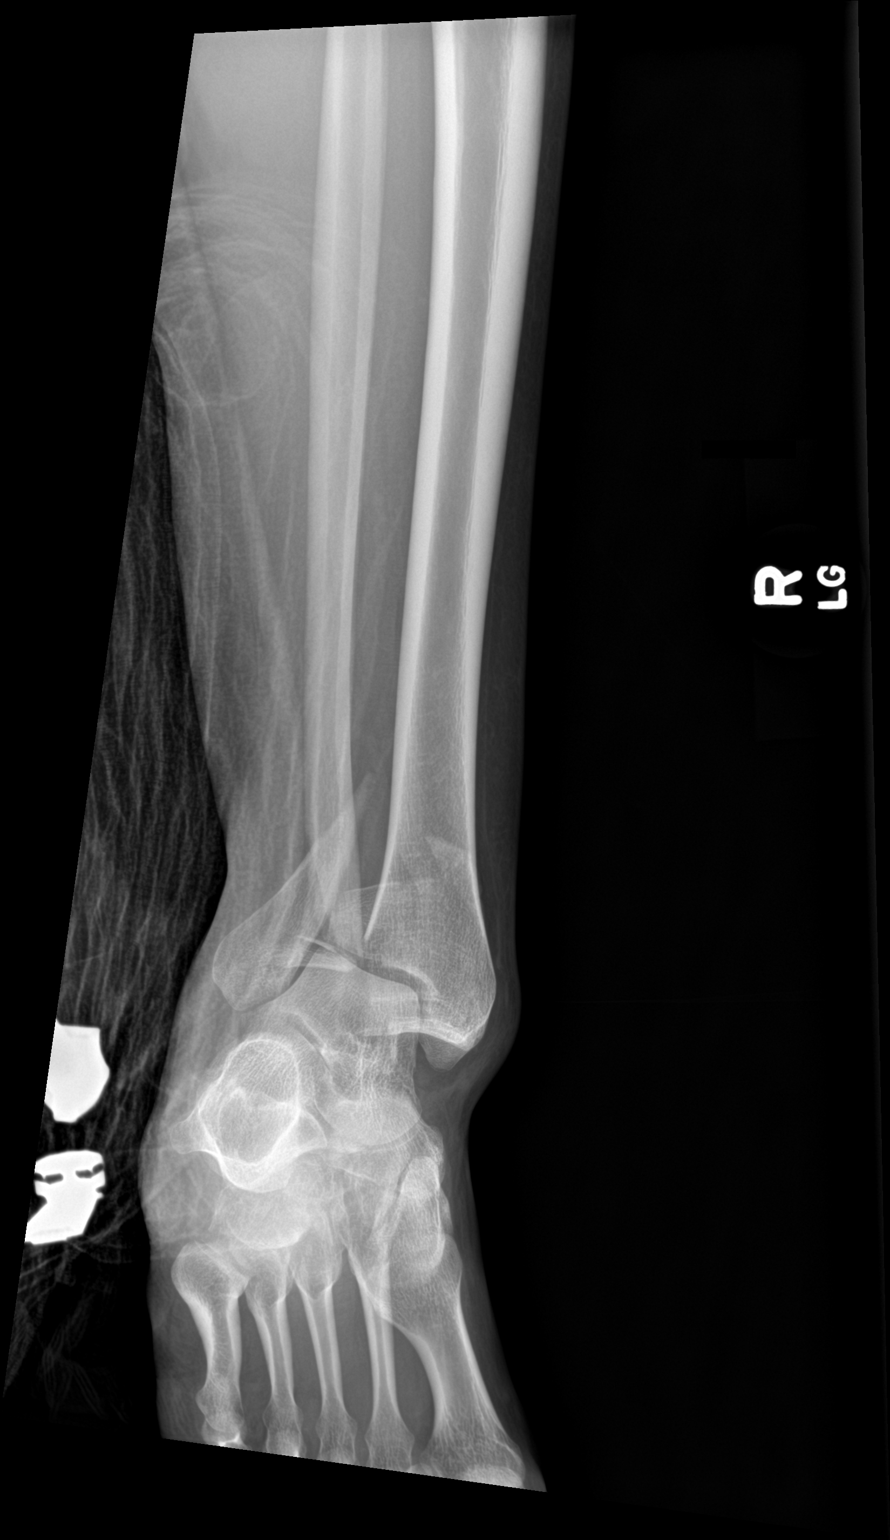

[abdomen kub (2 of 2)]
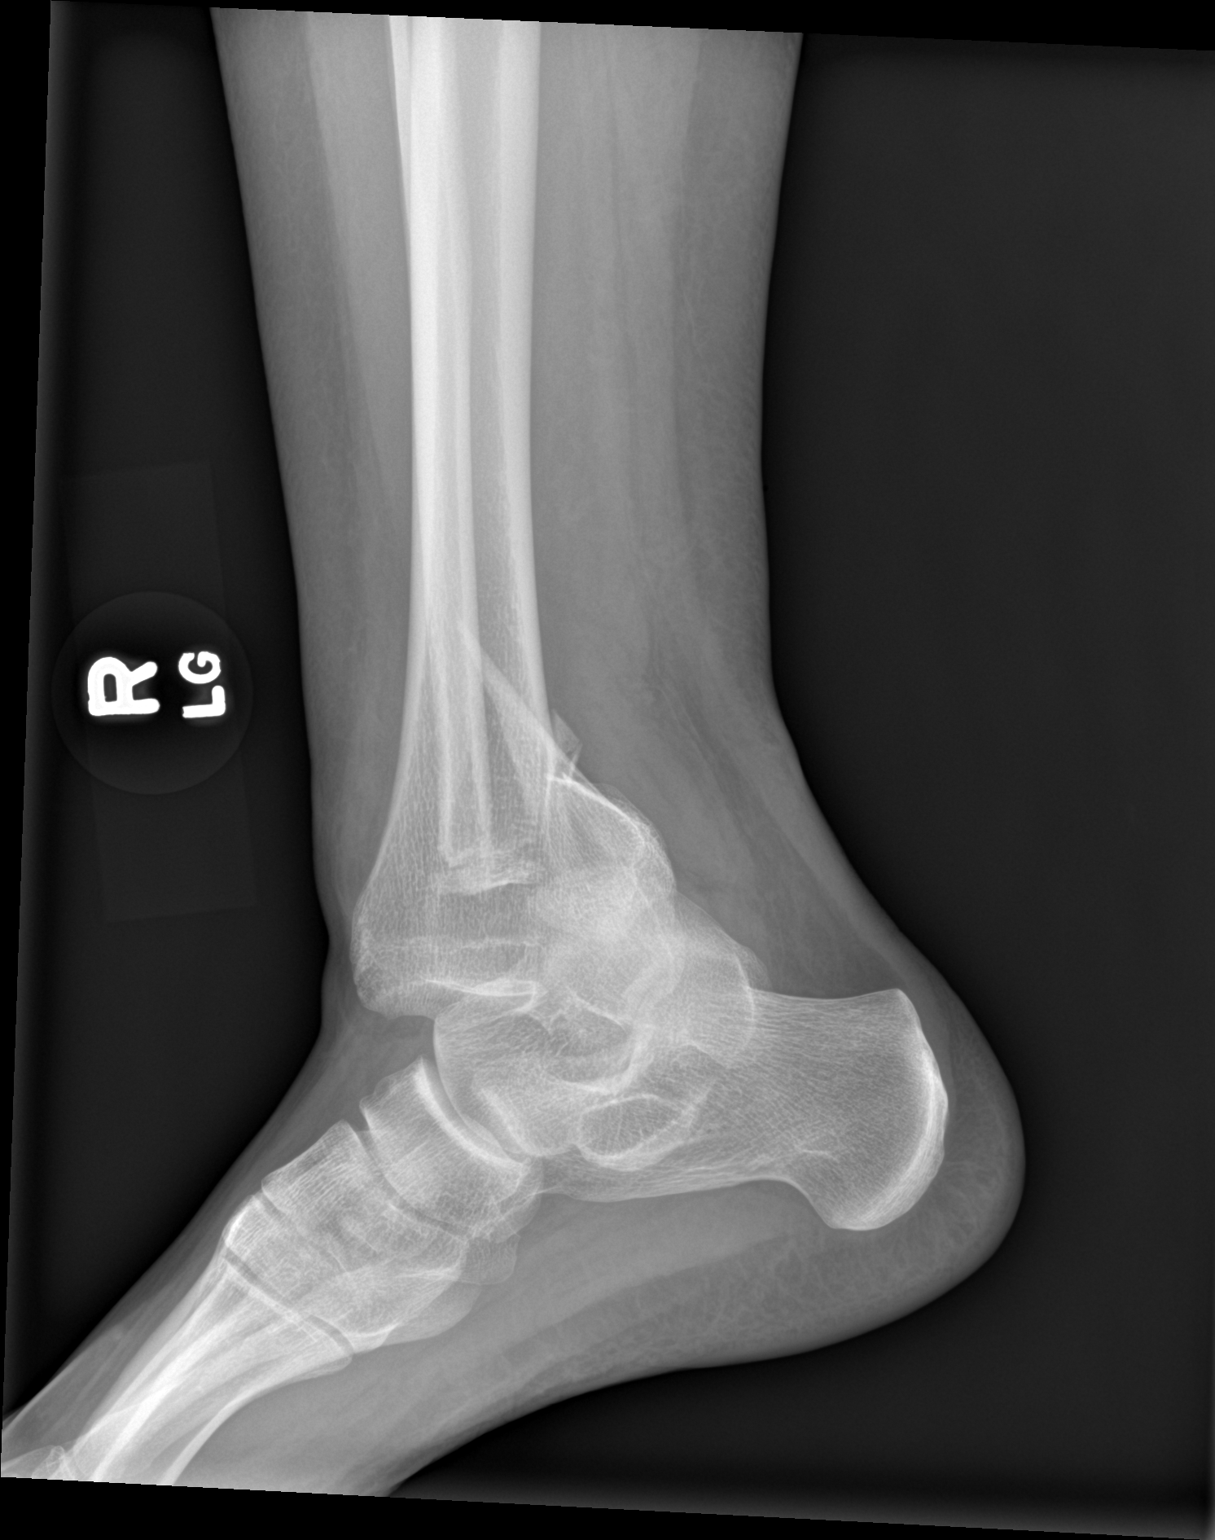

[2 of 2 positions shown; findings below may reference images not displayed]

FINDINGS: Acute comminuted fracture involving the distal shaft of the fibula
with moderate posterior and lateral angulation of distal fracture
fragment. Posterior dislocation of the talus with respect to the
distal tibia. Acute comminuted posterior malleolar fracture.
IMPRESSION: 1. Acute comminuted and angulated distal fibular fracture
2. Acute comminuted posterior malleolar fracture. There is posterior
dislocation of the talus with respect to the distal tibia

## 2021-11-26 DIAGNOSIS — T849XXA Unspecified complication of internal orthopedic prosthetic device, implant and graft, initial encounter: Secondary | ICD-10-CM | POA: Insufficient documentation

## 2022-01-05 ENCOUNTER — Emergency Department
Admission: EM | Admit: 2022-01-05 | Discharge: 2022-01-05 | Disposition: A | Discharge status: Home / Self Care | Payer: 59 | Attending: Emergency Medicine | Admitting: Emergency Medicine

## 2022-01-05 ENCOUNTER — Other Ambulatory Visit: Payer: Self-pay

## 2022-01-05 DIAGNOSIS — Y92410 Unspecified street and highway as the place of occurrence of the external cause: Secondary | ICD-10-CM | POA: Insufficient documentation

## 2022-01-05 DIAGNOSIS — S199XXA Unspecified injury of neck, initial encounter: Secondary | ICD-10-CM | POA: Diagnosis present

## 2022-01-05 DIAGNOSIS — M7918 Myalgia, other site: Secondary | ICD-10-CM

## 2022-01-05 MED ORDER — CYCLOBENZAPRINE HCL 5 MG PO TABS: 5.0000 mg | ORAL_TABLET | Freq: Three times a day (TID) | ORAL | 0 refills | Status: DC | PRN

## 2022-01-05 MED ORDER — IBUPROFEN 800 MG PO TABS: 800.0000 mg | ORAL_TABLET | Freq: Three times a day (TID) | ORAL | 0 refills | Status: DC | PRN

## 2022-01-05 MED ORDER — ONDANSETRON 4 MG PO TBDP: 4.0000 mg | ORAL_TABLET | Freq: Three times a day (TID) | ORAL | 0 refills | Status: DC | PRN
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
## 2022-01-05 NOTE — Discharge Instructions (Addendum)
Your exam is normal at this time.  Expect to be sore and stiff the next few days.  Take the prescription medicines as directed.  Follow with primary provider return to the ED if needed.  You may follow-up with neurology for your headache syndrome if needed. ?
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
## 2022-05-09 ENCOUNTER — Other Ambulatory Visit (HOSPITAL_COMMUNITY)
Admission: RE | Admit: 2022-05-09 | Discharge: 2022-05-09 | Disposition: A | Discharge status: Home / Self Care | Payer: 59 | Source: Ambulatory Visit | Attending: Obstetrics | Admitting: Obstetrics

## 2022-05-09 ENCOUNTER — Ambulatory Visit (INDEPENDENT_AMBULATORY_CARE_PROVIDER_SITE_OTHER): Payer: 59 | Admitting: Obstetrics

## 2022-05-09 VITALS — BP 122/70 | Wt 177.0 lb

## 2022-05-09 DIAGNOSIS — Z113 Encounter for screening for infections with a predominantly sexual mode of transmission: Secondary | ICD-10-CM | POA: Insufficient documentation

## 2022-05-09 DIAGNOSIS — Z20828 Contact with and (suspected) exposure to other viral communicable diseases: Secondary | ICD-10-CM
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
## 2022-05-10 LAB — CERVICOVAGINAL ANCILLARY ONLY
Bacterial Vaginitis (gardnerella): NEGATIVE
Chlamydia: NEGATIVE
Comment: NEGATIVE
Comment: NEGATIVE
Comment: NEGATIVE
Comment: NORMAL
Neisseria Gonorrhea: NEGATIVE
Trichomonas: NEGATIVE

## 2022-05-10 LAB — HSV(HERPES SIMPLEX VRS) I + II AB-IGG
HSV 1 Glycoprotein G Ab, IgG: <0.91 index (ref 0.00–0.90)
HSV 2 IgG, Type Spec: <0.91 index (ref 0.00–0.90)

## 2022-05-13 ENCOUNTER — Encounter: Payer: Self-pay | Admitting: Obstetrics

## 2022-12-04 ENCOUNTER — Ambulatory Visit: Payer: 59

## 2022-12-04 NOTE — Progress Notes (Deleted)
    NURSE VISIT NOTE  Subjective:    Patient ID: DANETTA PROM, female    DOB: 07/06/1996, 27 y.o.   MRN: [id number]  HPI  Patient is a 27 y.o. G0P0000 female who presents for {pe vag discharge desc:315065} vaginal discharge for *** {gen duration:315003}. Denies abnormal vaginal bleeding or significant pelvic pain or fever. {Actions; denies/reports/admits to:19208} {UTI Symptoms:210800002}. Patient {has/denies:315300} history of known exposure to STD.   Objective:    There were no vitals taken for this visit.   @THIS  VISIT ONLY@  Assessment:   No diagnosis found.  {vaginitis type:315262}  Plan:   GC and chlamydia DNA  probe sent to lab. Treatment: {vaginitis tx:315263} ROV prn if symptoms persist or worsen.   Quintella Baton, CMA

## 2023-03-10 ENCOUNTER — Telehealth: Payer: Self-pay

## 2023-03-10 NOTE — Telephone Encounter (Signed)
Pt calling; no period in two months; has had situations where she would have neg home preg test and then miscarry in a few days; would like to have blood drawn.  [phone]

## 2023-03-12 NOTE — Telephone Encounter (Signed)
The patient is returning missed call. Next opening for office visit is 6/17 with MMF

## 2023-03-19 ENCOUNTER — Ambulatory Visit: Payer: 59

## 2023-03-28 ENCOUNTER — Ambulatory Visit (INDEPENDENT_AMBULATORY_CARE_PROVIDER_SITE_OTHER): Payer: 59

## 2023-03-28 VITALS — BP 105/68 | HR 91 | Ht 71.0 in | Wt 183.7 lb

## 2023-03-28 DIAGNOSIS — N912 Amenorrhea, unspecified: Secondary | ICD-10-CM | POA: Diagnosis not present

## 2023-03-28 DIAGNOSIS — Z3202 Encounter for pregnancy test, result negative: Secondary | ICD-10-CM | POA: Diagnosis not present

## 2023-03-28 DIAGNOSIS — Z3200 Encounter for pregnancy test, result unknown: Secondary | ICD-10-CM

## 2023-03-28 DIAGNOSIS — N926 Irregular menstruation, unspecified: Secondary | ICD-10-CM

## 2023-03-28 LAB — POCT URINE PREGNANCY: Preg Test, Ur: NEGATIVE

## 2023-03-28 NOTE — Progress Notes (Signed)
    NURSE VISIT NOTE  Subjective:    Patient ID: Lorraine Pena, female    DOB: 1995-11-24, 27 y.o.   MRN: [id number]  HPI  Patient is a 27 y.o. G0P0000 female who presents for evaluation of amenorrhea. She believes she could be pregnant.  Patient was not trying to become pregnant  Sexual Activity: single partner, contraception: none. Current symptoms also include: nausea. Last period was normal however, she states 3 days of clotting and cramping 03/23/23-03/26/23.    Objective:    BP 105/68   Pulse 91   Wt 183 lb 11.2 oz (83.3 kg)   LMP 01/12/2023   BMI 25.62 kg/m   Lab Review  Results for orders placed or performed in visit on 03/28/23  POCT urine pregnancy  Result Value Ref Range   Preg Test, Ur Negative Negative    Assessment:   1. Possible pregnancy, not yet confirmed   2. Amenorrhea   3. Irregular menstrual cycle     Plan:     Pregnancy Test: @PLANEND @Negative   Serial Beta Quant ordered per  Hartley Barefoot, CNM. Patient will check my chart for results. Will call with results    Loman Chroman, CMA

## 2023-03-29 LAB — BETA HCG QUANT (REF LAB): hCG Quant: <1 m[IU]/mL

## 2023-03-31 ENCOUNTER — Other Ambulatory Visit: Payer: 59

## 2023-04-14 ENCOUNTER — Telehealth: Payer: Self-pay | Admitting: Obstetrics

## 2023-04-14 ENCOUNTER — Ambulatory Visit: Payer: 59 | Admitting: Obstetrics

## 2023-04-14 NOTE — Telephone Encounter (Signed)
The patient called back and rescheduled for 7/12 at 2:15 pm with MMF

## 2023-04-14 NOTE — Telephone Encounter (Signed)
I contacted the patient via phone. Left message for the patient to rescheduled due to MMF out of office.

## 2023-05-02 ENCOUNTER — Ambulatory Visit: Payer: 59 | Admitting: Obstetrics

## 2023-05-20 ENCOUNTER — Ambulatory Visit (INDEPENDENT_AMBULATORY_CARE_PROVIDER_SITE_OTHER): Payer: 59 | Admitting: Obstetrics

## 2023-05-20 ENCOUNTER — Other Ambulatory Visit (HOSPITAL_COMMUNITY)
Admission: RE | Admit: 2023-05-20 | Discharge: 2023-05-20 | Disposition: A | Discharge status: Home / Self Care | Payer: 59 | Source: Ambulatory Visit | Attending: Obstetrics | Admitting: Obstetrics

## 2023-05-20 ENCOUNTER — Encounter: Payer: Self-pay | Admitting: Obstetrics

## 2023-05-20 VITALS — BP 110/74 | HR 67 | Ht 71.0 in | Wt 181.0 lb

## 2023-05-20 DIAGNOSIS — Z124 Encounter for screening for malignant neoplasm of cervix: Secondary | ICD-10-CM | POA: Insufficient documentation

## 2023-05-20 DIAGNOSIS — Z113 Encounter for screening for infections with a predominantly sexual mode of transmission: Secondary | ICD-10-CM | POA: Diagnosis present

## 2023-05-20 DIAGNOSIS — Z01419 Encounter for gynecological examination (general) (routine) without abnormal findings: Secondary | ICD-10-CM | POA: Insufficient documentation
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
## 2023-05-21 ENCOUNTER — Other Ambulatory Visit: Payer: Self-pay | Admitting: Obstetrics

## 2023-05-21 DIAGNOSIS — Z30018 Encounter for initial prescription of other contraceptives: Secondary | ICD-10-CM

## 2023-05-21 MED ORDER — PHEXXI 1.8-1-0.4 % VA GEL: 1.0000 | VAGINAL | 1 refills | Status: DC | PRN

## 2023-06-05 ENCOUNTER — Encounter: Payer: Self-pay | Admitting: Obstetrics

## 2024-11-10 ENCOUNTER — Ambulatory Visit (INDEPENDENT_AMBULATORY_CARE_PROVIDER_SITE_OTHER): Payer: Self-pay

## 2024-11-10 VITALS — BP 134/92 | HR 103 | Ht 71.0 in | Wt 177.6 lb

## 2024-11-10 DIAGNOSIS — N912 Amenorrhea, unspecified: Secondary | ICD-10-CM

## 2024-11-10 DIAGNOSIS — Z3491 Encounter for supervision of normal pregnancy, unspecified, first trimester: Secondary | ICD-10-CM

## 2024-11-10 DIAGNOSIS — O219 Vomiting of pregnancy, unspecified: Secondary | ICD-10-CM

## 2024-11-10 DIAGNOSIS — Z3201 Encounter for pregnancy test, result positive: Secondary | ICD-10-CM

## 2024-11-10 LAB — POCT URINE PREGNANCY: Preg Test, Ur: POSITIVE — AB

## 2024-11-10 MED ORDER — ONDANSETRON 4 MG PO TBDP: 4.0000 mg | ORAL_TABLET | Freq: Four times a day (QID) | ORAL | 0 refills | Status: DC | PRN

## 2024-11-10 NOTE — Progress Notes (Signed)
" ° ° °  NURSE VISIT NOTE  Subjective:    Patient ID: Lorraine Pena, female    DOB: 09/26/1996, 29 y.o.   MRN: [id number]  HPI  Patient is a 29 y.o. G22P0000 female who presents for evaluation of amenorrhea. She believes she could be pregnant. Pregnancy is desired. Sexual Activity: single partner, contraception: none. Current symptoms also include: breast tenderness, fatigue, morning sickness, nausea, and positive home pregnancy test. Last period was normal.    Objective:    BP (!) 134/92   Pulse (!) 103   Ht 5' 11 (1.803 m)   Wt 177 lb 9.6 oz (80.6 kg)   LMP 08/17/2024 (Approximate)   BMI 24.77 kg/m     Assessment:   1. Uncertain dates, antepartum, first trimester   2. Nausea/vomiting in pregnancy     Plan:   Pregnancy Test: Positive  Estimated Date of Delivery: 05/24/2025 GA: [redacted]w[redacted]d BP Cuff Measurement taken. Cuff Size Adult Small Encouraged well-balanced diet, plenty of rest when needed, pre-natal vitamins daily and walking for exercise.  Discussed self-help for nausea, avoiding OTC medications until consulting provider or pharmacist, other than Tylenol  as needed, minimal caffeine (1-2 cups daily) and avoiding alcohol.   She will schedule her nurse visit @ 7-[redacted] wks pregnant, u/s for dating @10  wk, and NOB visit at [redacted] wk pregnant.    Feel free to call with any questions.    Burnard LITTIE Ro, CMA   "

## 2024-11-19 ENCOUNTER — Telehealth: Payer: Self-pay

## 2024-11-19 DIAGNOSIS — Z3480 Encounter for supervision of other normal pregnancy, unspecified trimester: Secondary | ICD-10-CM | POA: Insufficient documentation

## 2024-11-19 DIAGNOSIS — O219 Vomiting of pregnancy, unspecified: Secondary | ICD-10-CM

## 2024-11-19 MED ORDER — PROMETHAZINE HCL 25 MG PO TABS: 25.0000 mg | ORAL_TABLET | Freq: Four times a day (QID) | ORAL | 0 refills | Status: AC | PRN

## 2024-11-19 NOTE — Progress Notes (Signed)
 New OB Intake  I connected with  Lorraine Pena on 11/19/24 at  2:15 PM EST by MyChart Video Visit and verified that I am speaking with the correct person using two identifiers. Nurse is located at [facility] and pt is located at home.  I discussed the limitations, risks, security and privacy concerns of performing an evaluation and management service by telephone and the availability of in person appointments. I also discussed with the patient that there may be a patient responsible charge related to this service. The patient expressed understanding and agreed to proceed.  I explained I am completing New OB Intake today. Patient's LMP was 08/17/24. She said she was seen at the ER in [place]  on 11/15/24 and had ultrasound done and was advised she was [redacted]w[redacted]d. She does not have a MyChart with organization where she was seen on Monday. I checked with my lead nurse and was advised to ask patient to share information with us  if she had MyChart with them so our providers can review US  report. Patient wants to keep US  with us  scheduled on 12/10/24. Pt is G2/P0. I reviewed her allergies, medications, Medical/Surgical/OB history, and appropriate screenings. There are cats in the home: yes. If yes: Indoor. Based on history, this is a/an pregnancy uncomplicated . Her obstetrical history is significant for N/A.  Patient Active Problem List   Diagnosis Date Noted   Supervision of other normal pregnancy, antepartum 11/19/2024   Complication associated with orthopedic device 11/26/2021   Anxiety and depression 10/03/2021   Closed trimalleolar fracture of right ankle 02/23/2020    Concerns addressed today: Nausea and vomiting, Zofran  is not working.  Delivery Plans:  Plans to deliver at [facility].  Anatomy US  Explained first scheduled US  will be 12/10/24. Anatomy US  will be scheduled around [redacted] weeks gestational age.  Labs Discussed genetic screening with patient. Patient desires  genetic testing to be drawn at new OB visit. Discussed possible labs to be drawn at new OB appointment.  COVID Vaccine Patient has not had COVID vaccine.   Social Determinants of Health Food Insecurity: denies food insecurity  Transportation: Patient denies transportation needs. Childcare: Discussed no children allowed at ultrasound appointments.   First visit review I reviewed new OB appt with pt. I explained she will have blood work and pap smear/pelvic exam if indicated. Explained pt will be seen by an [facility] provider at first visit.  Beola Skeens, CMA 11/19/2024  3:46 PM

## 2024-11-19 NOTE — Patient Instructions (Signed)
 First Trimester of Pregnancy: What to Know  The first trimester of pregnancy starts on the first day of your last monthly period until the end of week 13. This is months 1 through 3 of pregnancy. A week after a sperm fertilizes an egg, the egg will implant into the wall of the uterus and begin to develop into a baby. Body changes during your first trimester Your body goes through many changes during pregnancy. The changes usually return to normal after your baby is born. Physical changes Your breasts may grow larger and may hurt. The area around your nipples may get darker. Your periods will stop. Your hair and nails may grow faster. You may pee more often. Health changes You may tire easily. Your gums may bleed and may be sensitive when you brush and floss. You may not feel hungry. You may have heartburn. You may throw up or feel like you may throw up. You may want to eat some foods, but not others. You may have headaches. You may have trouble pooping (constipation). Other changes Your emotions may change from day to day. You may have more dreams. Follow these instructions at home: Medicines Talk to your health care provider if you're taking medicines. Ask if the medicines are safe to take during pregnancy. Your provider may change the medicines that you take. Do not take any medicines unless told to by your provider. Take a prenatal vitamin that has at least 600 micrograms (mcg) of folic acid. Do not use herbal medicines, illegal substances, or medicines that are not approved by your provider. Eating and drinking While you're pregnant your body needs extra food for your growing baby. Talk with your provider about what to eat while pregnant. Activity Most women are able to exercise during pregnancy. Exercises may need to change as your pregnancy goes on. Talk to your provider about your activities and exercise routines. Relieving pain and discomfort Wear a good, supportive bra if  your breasts hurt. Rest with your legs raised if you have leg cramps or low back pain. Safety Wear your seatbelt at all times when you're in a car. Talk to your provider if someone hits you, hurts you, or yells at you. Talk with your provider if you're feeling sad or have thoughts of hurting yourself. Lifestyle Certain things can be harmful while you're pregnant. Follow these rules: Do not use hot tubs, steam rooms, or saunas. Do not douche. Do not use tampons or scented pads. Do not drink alcohol,smoke, vape, or use products with nicotine or tobacco in them. If you need help quitting, talk with your provider. Avoid cat litter boxes and soil used by cats. These things carry germs that can cause harm to your pregnancy and your baby. General instructions Keep all follow-up visits. It helps you and your unborn baby stay as healthy as possible. Write down your questions. Take them to your visits. Your provider will: Talk with you about your overall health. Give you advice or refer you to specialists who can help with different needs, including: Prenatal education classes. Mental health and counseling. Foods and healthy eating. Ask for help if you need help with food. Call your dentist and ask to be seen. Brush your teeth with a soft toothbrush. Floss gently. Where to find more information American Pregnancy Association: americanpregnancy.org [employer]: acog.org Office on [employer] Health: [place] Contact a health care provider if: You feel dizzy, faint, or have a fever. You vomit or have watery poop (  diarrhea) for 2 days or more. You have abnormal discharge or bleeding from your vagina. You have pain when you pee or your pee smells bad. You have cramps, pain, or pressure in your belly area. Get help right away if: You have trouble breathing or chest pain. You have any kind of injury, such as from a fall or a car crash. These symptoms may  be an emergency. Get help right away. Call 911. Do not wait to see if the symptoms will go away. Do not drive yourself to the hospital. This information is not intended to replace advice given to you by your health care provider. Make sure you discuss any questions you have with your health care provider. Document Revised: 08/16/2024 Document Reviewed: 02/07/2023 Elsevier Patient Education  2025 [employer].  Common Medications Safe in Pregnancy  Acne:      Constipation:  Benzoyl Peroxide     Colace  Clindamycin      Dulcolax Suppository  Topica Erythromycin     Fibercon  Salicylic Acid      Metamucil         Miralax  AVOID:        Senakot   Accutane    Cough:  Retin-A       Cough Drops  Tetracycline      Phenergan  w/ Codeine  if Rx  Minocycline      Robitussin (Plain & DM)  Antibiotics:     Crabs/Lice:  Ceclor       RID  Cephalosporins    AVOID:  E-Mycins      Kwell  Keflex  Macrobid /Macrodantin    Diarrhea:  Penicillin      Kao-Pectate  Zithromax       Imodium AD         PUSH FLUIDS AVOID:       Cipro     Fever:  Tetracycline      Tylenol  (Regular or Extra  Minocycline       Strength)  Levaquin      Extra Strength-Do not          Exceed 8 tabs/24 hrs Caffeine:        200mg /day (equiv. To 1 cup of coffee or  approx. 3 12 oz sodas)         Gas: Cold/Hayfever:       Gas-X  Benadryl       Mylicon  Claritin       Phazyme  **Claritin-D        Chlor-Trimeton    Headaches:  Dimetapp      ASA-Free Excedrin  Drixoral-Non-Drowsy     Cold Compress  Mucinex (Guaifenasin)     Tylenol  (Regular or Extra  Sudafed/Sudafed-12 Hour     Strength)  **Sudafed PE Pseudoephedrine   Tylenol  Cold & Sinus     Vicks Vapor Rub  Zyrtec  **AVOID if Problems With Blood Pressure         Heartburn: Avoid lying down for at least 1 hour after meals  Aciphex      Maalox     Rash:  Milk of Magnesia     Benadryl     Mylanta       1% Hydrocortisone Cream  Pepcid  Pepcid Complete   Sleep  Aids:  Prevacid      Ambien    Prilosec       Benadryl   Rolaids       Chamomile Tea  Tums (Limit 4/day)     Unisom  Tylenol  PM         Warm milk-add vanilla or  Hemorrhoids:       Sugar for taste  Anusol/Anusol H.C.  (RX: Analapram 2.5%)  Sugar Substitutes:  Hydrocortisone OTC     Ok in moderation  Preparation H      Tucks        Vaseline lotion applied to tissue with wiping    Herpes:     Throat:  Acyclovir      Oragel  Famvir  Valtrex     Vaccines:         Flu Shot Leg Cramps:       *Gardasil  Benadryl       Hepatitis A         Hepatitis B Nasal Spray:       Pneumovax  Saline Nasal Spray     Polio Booster         Tetanus Nausea:       Tuberculosis test or PPD  Vitamin B6 25 mg TID   AVOID:    Dramamine      *Gardasil  Emetrol       Live Poliovirus  Ginger Root 250 mg QID    MMR (measles, mumps &  High Complex Carbs @ Bedtime    rebella)  Sea Bands-Accupressure    Varicella (Chickenpox)  Unisom 1/2 tab TID     *No known complications           If received before Pain:         Known pregnancy;   Darvocet       Resume series after  Lortab        Delivery  Percocet    Yeast:   Tramadol      Femstat  Tylenol  3      Gyne-lotrimin  Ultram       Monistat  Vicodin           MISC:         All Sunscreens           Hair Coloring/highlights          Insect Repellant's          (Including DEET)         Mystic Tans   Commonly Asked Questions During Pregnancy  Cats: A parasite can be excreted in cat feces.  To avoid exposure you need to have another person empty the little box.  If you must empty the litter box you will need to wear gloves.  Wash your hands after handling your cat.  This parasite can also be found in raw or undercooked meat so this should also be avoided.  Colds, Sore Throats, Flu: Please check your medication sheet to see what you can take for symptoms.  If your symptoms are unrelieved by these medications please call the office.  Dental Work: Most  any dental work agricultural consultant recommends is permitted.  X-rays should only be taken during the first trimester if absolutely necessary.  Your abdomen should be shielded with a lead apron during all x-rays.  Please notify your provider prior to receiving any x-rays.  Novocaine is fine; gas is not recommended.  If your dentist requires a note from us  prior to dental work please call the office and we will provide one for you.  Exercise: Exercise is an important part of staying healthy during your pregnancy.  You may continue most exercises you were accustomed to prior to pregnancy.  Later  in your pregnancy you will most likely notice you have difficulty with activities requiring balance like riding a bicycle.  It is important that you listen to your body and avoid activities that put you at a higher risk of falling.  Adequate rest and staying well hydrated are a must!  If you have questions about the safety of specific activities ask your provider.    Exposure to Children with illness: Try to avoid obvious exposure; report any symptoms to us  when noted,  If you have chicken pos, red measles or mumps, you should be immune to these diseases.   Please do not take any vaccines while pregnant unless you have checked with your OB provider.  Fetal Movement: After 28 weeks we recommend you do kick counts twice daily.  Lie or sit down in a calm quiet environment and count your baby movements kicks.  You should feel your baby at least 10 times per hour.  If you have not felt 10 kicks within the first hour get up, walk around and have something sweet to eat or drink then repeat for an additional hour.  If count remains less than 10 per hour notify your provider.  Fumigating: Follow your pest control agent's advice as to how long to stay out of your home.  Ventilate the area well before re-entering.  Hemorrhoids:   Most over-the-counter preparations can be used during pregnancy.  Check your medication to see what is  safe to use.  It is important to use a stool softener or fiber in your diet and to drink lots of liquids.  If hemorrhoids seem to be getting worse please call the office.   Hot Tubs:  Hot tubs Jacuzzis and saunas are not recommended while pregnant.  These increase your internal body temperature and should be avoided.  Intercourse:  Sexual intercourse is safe during pregnancy as long as you are comfortable, unless otherwise advised by your provider.  Spotting may occur after intercourse; report any bright red bleeding that is heavier than spotting.  Labor:  If you know that you are in labor, please go to the hospital.  If you are unsure, please call the office and let us  help you decide what to do.  Lifting, straining, etc:  If your job requires heavy lifting or straining please check with your provider for any limitations.  Generally, you should not lift items heavier than that you can lift simply with your hands and arms (no back muscles)  Painting:  Paint fumes do not harm your pregnancy, but may make you ill and should be avoided if possible.  Latex or water based paints have less odor than oils.  Use adequate ventilation while painting.  Permanents & Hair Color:  Chemicals in hair dyes are not recommended as they cause increase hair dryness which can increase hair loss during pregnancy.   Highlighting and permanents are allowed.  Dye may be absorbed differently and permanents may not hold as well during pregnancy.  Sunbathing:  Use a sunscreen, as skin burns easily during pregnancy.  Drink plenty of fluids; avoid over heating.  Tanning Beds:  Because their possible side effects are still unknown, tanning beds are not recommended.  Ultrasound Scans:  Routine ultrasounds are performed at approximately 20 weeks.  You will be able to see your baby's general anatomy an if you would like to know the gender this can usually be determined as well.  If it is questionable when you conceived you may  also receive  an ultrasound early in your pregnancy for dating purposes.  Otherwise ultrasound exams are not routinely performed unless there is a medical necessity.  Although you can request a scan we ask that you pay for it when conducted because insurance does not cover  patient request scans.  Work: If your pregnancy proceeds without complications you may work until your due date, unless your physician or employer advises otherwise.  Round Ligament Pain/Pelvic Discomfort:  Sharp, shooting pains not associated with bleeding are fairly common, usually occurring in the second trimester of pregnancy.  They tend to be worse when standing up or when you remain standing for long periods of time.  These are the result of pressure of certain pelvic ligaments called round ligaments.  Rest, Tylenol  and heat seem to be the most effective relief.  As the womb and fetus grow, they rise out of the pelvis and the discomfort improves.  Please notify the office if your pain seems different than that described.  It may represent a more serious condition.

## 2024-11-22 ENCOUNTER — Other Ambulatory Visit: Payer: Self-pay

## 2024-11-25 ENCOUNTER — Encounter: Payer: Self-pay | Admitting: Licensed Practical Nurse

## 2024-12-10 ENCOUNTER — Other Ambulatory Visit: Payer: Self-pay
